# Patient Record
Sex: Female | Born: 1960 | Race: White | Hispanic: No | Smoking: Former smoker
Health system: Southern US, Community
[De-identification: ages and names within clinical notes are randomized; demographics above are authoritative.]

## PROBLEM LIST (undated history)

## (undated) DIAGNOSIS — K219 Gastro-esophageal reflux disease without esophagitis: Secondary | ICD-10-CM

## (undated) HISTORY — PX: OTHER SURGICAL HISTORY: SHX169

## (undated) HISTORY — DX: Gastro-esophageal reflux disease without esophagitis: K21.9

---

## 1999-10-05 ENCOUNTER — Other Ambulatory Visit: Admission: RE | Admit: 1999-10-05 | Discharge: 1999-10-05 | Payer: Self-pay | Admitting: Obstetrics and Gynecology

## 2002-03-11 ENCOUNTER — Other Ambulatory Visit: Admission: RE | Admit: 2002-03-11 | Discharge: 2002-03-11 | Payer: Self-pay | Admitting: Obstetrics & Gynecology

## 2016-05-19 ENCOUNTER — Encounter: Payer: Self-pay | Admitting: Family Medicine

## 2016-05-19 ENCOUNTER — Ambulatory Visit (INDEPENDENT_AMBULATORY_CARE_PROVIDER_SITE_OTHER): Payer: BLUE CROSS/BLUE SHIELD | Admitting: Family Medicine

## 2016-05-19 VITALS — BP 109/73 | HR 73 | Ht 65.25 in | Wt 219.9 lb

## 2016-05-19 DIAGNOSIS — F17200 Nicotine dependence, unspecified, uncomplicated: Secondary | ICD-10-CM

## 2016-05-19 DIAGNOSIS — Z6836 Body mass index (BMI) 36.0-36.9, adult: Secondary | ICD-10-CM | POA: Diagnosis not present

## 2016-05-19 DIAGNOSIS — Z1239 Encounter for other screening for malignant neoplasm of breast: Secondary | ICD-10-CM | POA: Diagnosis not present

## 2016-05-19 DIAGNOSIS — B078 Other viral warts: Secondary | ICD-10-CM

## 2016-05-19 DIAGNOSIS — Z0000 Encounter for general adult medical examination without abnormal findings: Secondary | ICD-10-CM | POA: Diagnosis not present

## 2016-05-19 DIAGNOSIS — Z716 Tobacco abuse counseling: Secondary | ICD-10-CM | POA: Insufficient documentation

## 2016-05-19 NOTE — Patient Instructions (Addendum)
-  AHA guidelines for exercise of 150 minutes of moderate intensity aerobic activity per week discussed and encouraged.   Discussed how regular exercise will improve brain function and memory, as well as improve mood, boost immune system and help with weight management, among the other, more well-known effects of exercise such as decreasing risk for hypertension, diabetes, hyperlipidemia etc.  - Sleep hygiene discussed. Counseled on negative effects of "blue light" from items such as computer screens, smart phones, TVs etc.   -  The AHA strongly endorses consumption of a diet that contains a variety of foods from all the food categories with an emphasis on fruits and vegetables; fat-free and low-fat dairy products; cereal and grain products; legumes and nuts; and fish, poultry, and or lean meats.   Excessive food intake, especially of foods high in saturated and trans fats, sugar, and salt, should be avoided   Steps to Quit Smoking  Smoking tobacco can be harmful to your health and can affect almost every organ in your body. Smoking puts you, and those around you, at risk for developing many serious chronic diseases. Quitting smoking is difficult, but it is one of the best things that you can do for your health. It is never too late to quit. WHAT ARE THE BENEFITS OF QUITTING SMOKING? When you quit smoking, you lower your risk of developing serious diseases and conditions, such as:  Lung cancer or lung disease, such as COPD.  Heart disease.  Stroke.  Heart attack.  Infertility.  Osteoporosis and bone fractures. Additionally, symptoms such as coughing, wheezing, and shortness of breath may get better when you quit. You may also find that you get sick less often because your body is stronger at fighting off colds and infections. If you are pregnant, quitting smoking can help to reduce your chances of having a baby of low birth weight. HOW DO I GET READY TO QUIT? When you decide to quit smoking,  create a plan to make sure that you are successful. Before you quit:  Pick a date to quit. Set a date within the next two weeks to give you time to prepare.  Write down the reasons why you are quitting. Keep this list in places where you will see it often, such as on your bathroom mirror or in your car or wallet.  Identify the people, places, things, and activities that make you want to smoke (triggers) and avoid them. Make sure to take these actions:  Throw away all cigarettes at home, at work, and in your car.  Throw away smoking accessories, such as Set designerashtrays and lighters.  Clean your car and make sure to empty the ashtray.  Clean your home, including curtains and carpets.  Tell your family, friends, and coworkers that you are quitting. Support from your loved ones can make quitting easier.  Talk with your health care provider about your options for quitting smoking.  Find out what treatment options are covered by your health insurance. WHAT STRATEGIES CAN I USE TO QUIT SMOKING?  Talk with your healthcare provider about different strategies to quit smoking. Some strategies include:  Quitting smoking altogether instead of gradually lessening how much you smoke over a period of time. Research shows that quitting "cold [place]" is more successful than gradually quitting.  Attending in-person counseling to help you build problem-solving skills. You are more likely to have success in quitting if you attend several counseling sessions. Even short sessions of 10 minutes can be effective.  Finding resources  and support systems that can help you to quit smoking and remain smoke-free after you quit. These resources are most helpful when you use them often. They can include:  Online chats with a Veterinary surgeon.  Telephone quitlines.  Printed Materials engineer.  Support groups or group counseling.  Text messaging programs.  Mobile phone applications.  Taking medicines to help you quit  smoking. (If you are pregnant or breastfeeding, talk with your health care provider first.) Some medicines contain nicotine and some do not. Both types of medicines help with cravings, but the medicines that include nicotine help to relieve withdrawal symptoms. Your health care provider may recommend:  Nicotine patches, gum, or lozenges.  Nicotine inhalers or sprays.  Non-nicotine medicine that is taken by mouth. Talk with your health care provider about combining strategies, such as taking medicines while you are also receiving in-person counseling. Using these two strategies together makes you more likely to succeed in quitting than if you used either strategy on its own. If you are pregnant or breastfeeding, talk with your health care provider about finding counseling or other support strategies to quit smoking. Do not take medicine to help you quit smoking unless told to do so by your health care provider. WHAT THINGS CAN I DO TO MAKE IT EASIER TO QUIT? Quitting smoking might feel overwhelming at first, but there is a lot that you can do to make it easier. Take these important actions:  Reach out to your family and friends and ask that they support and encourage you during this time. Call telephone quitlines, reach out to support groups, or work with a counselor for support.  Ask people who smoke to avoid smoking around you.  Avoid places that trigger you to smoke, such as bars, parties, or smoke-break areas at work.  Spend time around people who do not smoke.  Lessen stress in your life, because stress can be a smoking trigger for some people. To lessen stress, try:  Exercising regularly.  Deep-breathing exercises.  Yoga.  Meditating.  Performing a body scan. This involves closing your eyes, scanning your body from head to toe, and noticing which parts of your body are particularly tense. Purposefully relax the muscles in those areas.  Download or purchase mobile phone or tablet  apps (applications) that can help you stick to your quit plan by providing reminders, tips, and encouragement. There are many free apps, such as QuitGuide from the [employer] developer for Disease Control and Prevention). You can find other support for quitting smoking (smoking cessation) through smokefree.gov and other websites. HOW WILL I FEEL WHEN I QUIT SMOKING? Within the first 24 hours of quitting smoking, you may start to feel some withdrawal symptoms. These symptoms are usually most noticeable 2-3 days after quitting, but they usually do not last beyond 2-3 weeks. Changes or symptoms that you might experience include:  Mood swings.  Restlessness, anxiety, or irritation.  Difficulty concentrating.  Dizziness.  Strong cravings for sugary foods in addition to nicotine.  Mild weight gain.  Constipation.  Nausea.  Coughing or a sore throat.  Changes in how your medicines work in your body.  A depressed mood.  Difficulty sleeping (insomnia). After the first 2-3 weeks of quitting, you may start to notice more positive results, such as:  Improved sense of smell and taste.  Decreased coughing and sore throat.  Slower heart rate.  Lower blood pressure.  Clearer skin.  The ability to breathe more easily.  Fewer sick days. Quitting smoking is  very challenging for most people. Do not get discouraged if you are not successful the first time. Some people need to make many attempts to quit before they achieve long-term success. Do your best to stick to your quit plan, and talk with your health care provider if you have any questions or concerns.   This information is not intended to replace advice given to you by your health care provider. Make sure you discuss any questions you have with your health care provider.   Document Released: 08/29/2001 Document Revised: 01/19/2015 Document Reviewed: 01/19/2015    Smoking Cessation, Tips for Success If you are ready to quit smoking,  congratulations! You have chosen to help yourself be healthier. Cigarettes bring nicotine, tar, carbon monoxide, and other irritants into your body. Your lungs, heart, and blood vessels will be able to work better without these poisons. There are many different ways to quit smoking. Nicotine gum, nicotine patches, a nicotine inhaler, or nicotine nasal spray can help with physical craving. Hypnosis, support groups, and medicines help break the habit of smoking. WHAT THINGS CAN I DO TO MAKE QUITTING EASIER?  Here are some tips to help you quit for good:  Pick a date when you will quit smoking completely. Tell all of your friends and family about your plan to quit on that date.  Do not try to slowly cut down on the number of cigarettes you are smoking. Pick a quit date and quit smoking completely starting on that day.  Throw away all cigarettes.   Clean and remove all ashtrays from your home, work, and car.  On a card, write down your reasons for quitting. Carry the card with you and read it when you get the urge to smoke.  Cleanse your body of nicotine. Drink enough water and fluids to keep your urine clear or pale yellow. Do this after quitting to flush the nicotine from your body.  Learn to predict your moods. Do not let a bad situation be your excuse to have a cigarette. Some situations in your life might tempt you into wanting a cigarette.  Never have "just one" cigarette. It leads to wanting another and another. Remind yourself of your decision to quit.  Change habits associated with smoking. If you smoked while driving or when feeling stressed, try other activities to replace smoking. Stand up when drinking your coffee. Brush your teeth after eating. Sit in a different chair when you read the paper. Avoid alcohol while trying to quit, and try to drink fewer caffeinated beverages. Alcohol and caffeine may urge you to smoke.  Avoid foods and drinks that can trigger a desire to smoke, such as  sugary or spicy foods and alcohol.  Ask people who smoke not to smoke around you.  Have something planned to do right after eating or having a cup of coffee. For example, plan to take a walk or exercise.  Try a relaxation exercise to calm you down and decrease your stress. Remember, you may be tense and nervous for the first 2 weeks after you quit, but this will pass.  Find new activities to keep your hands busy. Play with a pen, coin, or rubber band. Doodle or draw things on paper.  Brush your teeth right after eating. This will help cut down on the craving for the taste of tobacco after meals. You can also try mouthwash.   Use oral substitutes in place of cigarettes. Try using lemon drops, carrots, cinnamon sticks, or chewing gum. Keep  them handy so they are available when you have the urge to smoke.  When you have the urge to smoke, try deep breathing.  Designate your home as a nonsmoking area.  If you are a heavy smoker, ask your health care provider about a prescription for nicotine chewing gum. It can ease your withdrawal from nicotine.  Reward yourself. Set aside the cigarette money you save and buy yourself something nice.  Look for support from others. Join a support group or smoking cessation program. Ask someone at home or at work to help you with your plan to quit smoking.  Always ask yourself, "Do I need this cigarette or is this just a reflex?" Tell yourself, "Today, I choose not to smoke," or "I do not want to smoke." You are reminding yourself of your decision to quit.  Do not replace cigarette smoking with electronic cigarettes (commonly called e-cigarettes). The safety of e-cigarettes is unknown, and some may contain harmful chemicals.  If you relapse, do not give up! Plan ahead and think about what you will do the next time you get the urge to smoke. HOW WILL I FEEL WHEN I QUIT SMOKING? You may have symptoms of withdrawal because your body is used to nicotine (the  addictive substance in cigarettes). You may crave cigarettes, be irritable, feel very hungry, cough often, get headaches, or have difficulty concentrating. The withdrawal symptoms are only temporary. They are strongest when you first quit but will go away within 10-14 days. When withdrawal symptoms occur, stay in control. Think about your reasons for quitting. Remind yourself that these are signs that your body is healing and getting used to being without cigarettes. Remember that withdrawal symptoms are easier to treat than the major diseases that smoking can cause.  Even after the withdrawal is over, expect periodic urges to smoke. However, these cravings are generally short lived and will go away whether you smoke or not. Do not smoke! WHAT RESOURCES ARE AVAILABLE TO HELP ME QUIT SMOKING? Your health care provider can direct you to community resources or hospitals for support, which may include:  Group support.  Education.  Hypnosis.  Therapy.   This information is not intended to replace advice given to you by your health care provider. Make sure you discuss any questions you have with your health care provider.   Document Released: 06/02/2004 Document Revised: 09/25/2014 Document Reviewed: 02/20/2013 Elsevier Interactive Patient Education 2016 [employer].  Risk analyst Patient Education [employer]

## 2016-05-19 NOTE — Progress Notes (Signed)
New patient office visit note:  Impression and Recommendations:    1. Screening for breast cancer   2. BMI 36.0-36.9,adult   3. Routine general medical examination at a health care facility   4. Common wart    Routine counseling for general yearly screenings for HIV, hepatitis C as well as mammograms, colonoscopies etc. discussed with patient.  BMI 36.0-36.9,adult Explained to patient what BMI refers to, and what it means medically.    Told patient to think about it as a "medical risk stratification measurement" and how increasing BMI is associated with increasing risk/ or worsening state of various diseases such as hypertension, hyperlipidemia, diabetes, premature OA, depression etc.  American Heart Association guidelines for healthy diet, basically Mediterranean diet, and exercise guidelines of 30 minutes 5 days per week or more discussed in detail.  Health counseling performed.  All questions answered.  --> We'll obtain yearly screening blood work today since patient has not had any in several years.Maryclare Labrador see her back in a couple weeks to discuss results. We'll make to make appointment for yearly physical couple months thereafter.  h/o Common wart- legs Patient uses over-the-counter Compound W with good results. Told her we could freeze them off with liquid nitrogen if she would like.  Pt was in the office today for 40+ minutes, with over 50% time spent in face to face counseling of various medical concerns and in coordination of care  Orders Placed This Encounter  Procedures  . MM DIGITAL SCREENING BILATERAL  . COMPLETE METABOLIC PANEL WITH GFR  . CBC with Differential/Platelet  . Hepatitis C antibody  . Hemoglobin A1c  . Lipid panel  . TSH  . VITAMIN D 25 Hydroxy (Vit-D Deficiency, Fractures)  . HIV antibody    Patient's Medications  New Prescriptions   No medications on file  Previous Medications   LYSINE 1000 MG TABS    Take 1 tablet by mouth daily.    Modified Medications   No medications on file  Discontinued Medications   No medications on file    Return in about 3 weeks (around 06/09/2016) for discuss bld wrk.  The patient was counseled, risk factors were discussed, anticipatory guidance given.  Gross side effects, risk and benefits, and alternatives of medications discussed with patient.  Patient is aware that all medications have potential side effects and we are unable to predict every side effect or drug-drug interaction that may occur.  Expresses verbal understanding and consents to current therapy plan and treatment regimen.  Please see AVS handed out to patient at the end of our visit for further patient instructions/ counseling done pertaining to today's office visit.    Note: This document was prepared using Dragon voice recognition software and may include unintentional dictation errors.  ----------------------------------------------------------------------------------------------------------------------    Subjective:    Chief Complaint  Patient presents with  . Establish Care    HPI: Angela Long is a pleasant 55 y.o. female who presents to [facility] today to review their medical history with me and establish care.   - no blood wrk or Doc Visit in many yrs.  Last PAP-- 10 yrs at least.  Mammo- 1 in past 59yrs.  Never had colonoscopy - never will.    Partner 3-4 yrs now- Jillyn Hidden and lives with him and his son [place]- 6 yo.  She has 3 kids previous marriage. Twin 28yo- Hunter and [place], and 54yo S-  Darrol AngelHogan.   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   No exercise- no time and just doesn';t.  Used to walk daily- low carb, cut back a lot on drinking - lost 54 lbs 4 yrs ago doing low carb.    fam h/o DM in [place] at age 54- morbidly obese. No other major dx at younger ages.    Wt Readings from Last 3 Encounters:  05/19/16 219 lb 14.4 oz (99.7 kg)   BP Readings from Last 3  Encounters:  05/19/16 109/73   Pulse Readings from Last 3 Encounters:  05/19/16 73     Patient Active Problem List   Diagnosis Date Noted  . BMI 36.0-36.9,adult 05/19/2016  . h/o Common wart- legs 05/19/2016     Past Medical History:  Diagnosis Date  . GERD (gastroesophageal reflux disease)      Past Surgical History:  Procedure Laterality Date  . CESAREAN SECTION       Family History  Problem Relation Age of Onset  . Hypertension Mother   . Neuropathy Mother   . Heart disease Mother [id number]    PM  . Cancer Father [id number]    " abdominal cancer"  . Alcohol abuse Sister   . Hypertension Sister [id number]  . Diabetes Brother 45    type II  . Diabetes Maternal Grandmother   . Cancer Maternal Grandmother     pancreatic  . Drug abuse Son      History  Drug Use No    History  Alcohol Use  . 10.8 oz/week  . 18 Cans of beer per week    Comment: drinks on weekends only    History  Smoking Status  . Current Some Day Smoker  . Packs/day: 0.01  . Years: 35.00  Smokeless Tobacco  . Never Used    Patient's Medications  New Prescriptions   No medications on file  Previous Medications   LYSINE 1000 MG TABS    Take 1 tablet by mouth daily.  Modified Medications   No medications on file  Discontinued Medications   No medications on file    Allergies: Review of patient's allergies indicates no known allergies.  Review of Systems:   ( Completed via Adult Medical History Intake form today ) General:  Denies fever, chills, appetite changes, unexplained weight loss.  Optho/Auditory:   Denies visual changes, blurred vision/LOV, ringing in ears/ diff hearing Respiratory:   Denies SOB, DOE, cough, wheezing.  Cardiovascular:   Denies chest pain, palpitations, new onset peripheral edema  Gastrointestinal:   Denies nausea, vomiting, diarrhea.  Genitourinary:    Denies dysuria, increased frequency, flank pain.  Endocrine:     Denies hot or cold intolerance, polyuria,  polydipsia. Musculoskeletal:  Denies unexplained myalgias, joint swelling, arthralgias, gait problems.  Skin:  Denies rash, suspicious lesions or new/ changes in moles Neurological:    Denies dizziness, syncope, unexplained weakness, lightheadedness, numbness  Psychiatric/Behavioral:   Denies mood changes, suicidal or homicidal ideations, hallucinations    Objective:   Blood pressure 109/73, pulse 73, height 5' 5.25" (1.657 m), weight 219 lb 14.4 oz (99.7 kg).   Body mass index is 36.31 kg/m.   General: Well Developed, well nourished, and in no acute distress.  Neuro: Alert and oriented x3, extra-ocular muscles intact, sensation grossly intact.  HEENT: Normocephalic, atraumatic, pupils equal round reactive to light, neck supple, no gross masses, no carotid bruits, no JVD apprec Skin: no gross suspicious lesions or rashes  Cardiac: Regular rate and  rhythm, no murmurs rubs or gallops.  Respiratory: Essentially clear to auscultation bilaterally. Not using accessory muscles, speaking in full sentences.  Abdominal: Soft, not grossly distended Musculoskeletal: Ambulates w/o diff, FROM * 4 ext.  Vasc: less 2 sec cap RF, warm and pink  Psych:  No HI/SI, judgement and insight good, Euthymic mood. Full Affect.

## 2016-05-19 NOTE — Assessment & Plan Note (Signed)
Explained to patient what BMI refers to, and what it means medically.    Told patient to think about it as a "medical risk stratification measurement" and how increasing BMI is associated with increasing risk/ or worsening state of various diseases such as hypertension, hyperlipidemia, diabetes, premature OA, depression etc.  American Heart Association guidelines for healthy diet, basically Mediterranean diet, and exercise guidelines of 30 minutes 5 days per week or more discussed in detail.  Health counseling performed.  All questions answered.  --> We'll obtain yearly screening blood work today since patient has not had any in several years.Angela Long.   We'll see her back in a couple weeks to discuss results. We'll make to make appointment for yearly physical couple months thereafter.

## 2016-05-19 NOTE — Assessment & Plan Note (Addendum)
Encouraged abstinence; will consider it

## 2016-05-19 NOTE — Assessment & Plan Note (Signed)
Patient uses over-the-counter Compound W with good results. Told her we could freeze them off with liquid nitrogen if she would like.

## 2016-05-20 LAB — COMPLETE METABOLIC PANEL WITH GFR
ALBUMIN: 4.1 g/dL (ref 3.6–5.1)
ALT: 14 U/L (ref 6–29)
AST: 17 U/L (ref 10–35)
Alkaline Phosphatase: 81 U/L (ref 33–130)
BILIRUBIN TOTAL: 0.5 mg/dL (ref 0.2–1.2)
BUN: 10 mg/dL (ref 7–25)
CALCIUM: 9.3 mg/dL (ref 8.6–10.4)
CO2: 27 mmol/L (ref 20–31)
CREATININE: 0.6 mg/dL (ref 0.50–1.05)
Chloride: 105 mmol/L (ref 98–110)
GFR, Est Non African American: >89 mL/min (ref >=60)
Glucose, Bld: 79 mg/dL (ref 65–99)
Potassium: 5.4 mmol/L — ABNORMAL HIGH (ref 3.5–5.3)
Sodium: 142 mmol/L (ref 135–146)
TOTAL PROTEIN: 7.1 g/dL (ref 6.1–8.1)

## 2016-05-20 LAB — CBC WITH DIFFERENTIAL/PLATELET
BASOS PCT: 0 %
Basophils Absolute: 0 cells/uL (ref 0–200)
EOS ABS: 153 {cells}/uL (ref 15–500)
Eosinophils Relative: 3 %
HCT: 41.2 % (ref 35.0–45.0)
Hemoglobin: 13.8 g/dL (ref 11.7–15.5)
LYMPHS PCT: 29 %
Lymphs Abs: 1479 cells/uL (ref 850–3900)
MCH: 32.7 pg (ref 27.0–33.0)
MCHC: 33.5 g/dL (ref 32.0–36.0)
MCV: 97.6 fL (ref 80.0–100.0)
MONOS PCT: 11 %
MPV: 10.3 fL (ref 7.5–12.5)
Monocytes Absolute: 561 cells/uL (ref 200–950)
NEUTROS PCT: 57 %
Neutro Abs: 2907 cells/uL (ref 1500–7800)
PLATELETS: 259 10*3/uL (ref 140–400)
RBC: 4.22 MIL/uL (ref 3.80–5.10)
RDW: 13.3 % (ref 11.0–15.0)
WBC: 5.1 10*3/uL (ref 3.8–10.8)

## 2016-05-20 LAB — HIV ANTIBODY (ROUTINE TESTING W REFLEX): HIV 1&2 Ab, 4th Generation: NONREACTIVE

## 2016-05-20 LAB — VITAMIN D 25 HYDROXY (VIT D DEFICIENCY, FRACTURES): VIT D 25 HYDROXY: 32 ng/mL (ref 30–100)

## 2016-05-20 LAB — HEMOGLOBIN A1C
Hgb A1c MFr Bld: 4.7 % (ref <5.7)
MEAN PLASMA GLUCOSE: 88 mg/dL

## 2016-05-20 LAB — LIPID PANEL
CHOL/HDL RATIO: 2.1 ratio (ref <=5.0)
Cholesterol: 202 mg/dL — ABNORMAL HIGH (ref 125–200)
HDL: 95 mg/dL (ref >=46)
LDL CALC: 98 mg/dL (ref <130)
Triglycerides: 43 mg/dL (ref <150)
VLDL: 9 mg/dL (ref <30)

## 2016-05-20 LAB — TSH: TSH: 3.58 m[IU]/L

## 2016-05-20 LAB — HEPATITIS C ANTIBODY: HCV Ab: NEGATIVE

## 2016-05-31 ENCOUNTER — Ambulatory Visit (INDEPENDENT_AMBULATORY_CARE_PROVIDER_SITE_OTHER): Payer: BLUE CROSS/BLUE SHIELD | Admitting: Family Medicine

## 2016-05-31 ENCOUNTER — Encounter: Payer: Self-pay | Admitting: Family Medicine

## 2016-05-31 VITALS — BP 122/83 | HR 87 | Ht 65.25 in | Wt 222.5 lb

## 2016-05-31 DIAGNOSIS — H8110 Benign paroxysmal vertigo, unspecified ear: Secondary | ICD-10-CM | POA: Insufficient documentation

## 2016-05-31 DIAGNOSIS — J329 Chronic sinusitis, unspecified: Secondary | ICD-10-CM

## 2016-05-31 DIAGNOSIS — J3089 Other allergic rhinitis: Secondary | ICD-10-CM | POA: Diagnosis not present

## 2016-05-31 MED ORDER — FLUTICASONE PROPIONATE 50 MCG/ACT NA SUSP: 2.0000 | Freq: Every day | NASAL | 6 refills | Status: DC

## 2016-05-31 MED ORDER — FEXOFENADINE-PSEUDOEPHED ER 180-240 MG PO TB24: 1.0000 | ORAL_TABLET | Freq: Every day | ORAL | 9 refills | Status: DC

## 2016-05-31 NOTE — Patient Instructions (Addendum)
How to Treat Vertigo at Home with Exercises ? ?What is Vertigo? ? ?Vertigo is a relatively common symptom most often associated with conditions such as sinusitis (inflammation of your sinuses due to viruses, allergies, or bacterial infections), or an inner ear infection or ear trauma.   It can be brought on by trauma (e.g. a blow to the head or whiplash) or more serious things like minor strokes.   Symptoms can also be brought on by normal degenerative changes to your inner ear that occur with aging.  The condition tends to be more commonly seen in the elderly but it can occur in all ages.   ? ?Patients most often complain of dizziness, as if the room is spinning around them.   Symptoms are provoked by quick head movements or changes in position like going from standing to lying in bed, or even turning over in bed.   It may present with nausea and/or vomiting, and can be very debilitating to some folks.   ? ?By far the most common cause, known as Benign Paroxysmal Positional Vertigo (BPPV), is categorized by a sudden onset of symptoms, that are intense but short-lived (60 seconds or less), which is triggered by a change in head position.   Symptoms usually dissipate if you stay in one position and do not move your head.   Within the inner ear are collections of calcium carbonate crystals referred to as ?otoliths? which may become dislodged from their normal position and migrate into the semicircular canals of the inner ear, throwing off your body's ability to sense where you are in space.   ? ? ?Fig. 921 Anatomy of the Right Osseous Labyrinth. Henry Gray. Anatomy of the Human Body. 1918. ? ?  ? ?  ? ?  ? ? ?What Else Could Be Behind My Vertigo? ? ?Some other causes of vertigo include: ? ?Meniere?s disease (disorder of inner ear with ringing in ears, feeling of fullness/pressure within ear, and fluctuating hearing loss) ?Tumours ?Neurological disorders e.g. Multiple Sclerosis ?Motion Sickness (lack of coordination  between visual stimuli, inner ear balance and positional sense) ?Migraine ?Labyrinthitis (inflammation of the fluid-filled tubes and sacs within the inner ear; may also be associated with changes in hearing) ?Vestibular neuritis (inflammation of the nerves associated with transmission of sensory info from the inner ear; usually of viral origins) ? ?How it can be treated/cured? ?While certain medications have been prescribed for vertigo including Lorazepam your doing well 7 house the house going organizing and getting things ready for sale with the and Meclizine (for motion sickness), there exists no evidence to support a recommendation of any medication in the routine treatment of BPPV.  Clinical trials have demonstrated that repositioning techniques (listed below) are a superior option for management (Fife et al., 2008). ? ? ? ?Figure above:  (A) Instructions for the modified Epley procedure (MEP) for left ear posterior canal benign paroxysmal positional vertigo (PC-BPPV). For right ear BPPV, the procedure has to be performed in the opposite direction, starting with the head turned to the right side.  ?1. Start by sitting on a bed with your head turned 45? to the left. Place a pillow behind you so that on lying back it will be under your shoulders.  ?2. Lie back quickly with shoulders on the pillow, neck extended, and head resting on the bed. In this position, the affected (left) ear is underneath. Wait for 30 secondS.  ?3. Turn your head 90? to the right (without raising it), and   wait again for 30 seconds.  ?4. Turn your body and head another 90? to the right, and wait for another 30 seconds.  ?5. Sit up on the right side. This maneuver should be performed three times a day. Repeat this daily until you are free from positional vertigo for 24 hours. ? ? (B) Instructions for the modified Semont maneuver (MSM) for left ear PC-BPPV. For right ear BPPV, the maneuver has to be performed in the opposite direction,  starting with the head turned toward the left ear.  ?1. Sit upright on a bed with your head turned 45? toward the right ear.  ?2. Drop quickly to the left side, so that your head touches the bed behind your left ear. Wait 30 seconds.  ?3. Move head and trunk in a swift movement toward the other side without stopping in the upright position, so that your head comes to rest on the right side of your forehead. Wait again for 30 seconds.  ?4. Sit up again.  ?This maneuver should be performed three times a day. Repeat this daily until you are free from positional vertigo symptoms for 24 hours.  ? ?(   See the video in the supplementary material on the NeurologyWeb site; go to http://www.neurology.org/content/63/1/150/F1.expansion.html   ) ? ? ? ? ?You can also try this motion at home as well- ?Self-Treatment of Benign Paroxysmal Positional Vertigo ?Benign Paroxysmal Positioning Vertigo is caused by loose inner ear crystals in the inner ear that migrate while sleeping to the back-bottom inner ear balance canal, the so-called ?posterior semi-circular canal.? The maneuver demonstrated below is the way to reposition the loose crystals so that the symptoms caused by the loose crystals go away. You may have a floating, swaying sense while walking or sitting for a few days after this procedure.  ? ? ? ? ? ? ? ? ? ? ?

## 2016-05-31 NOTE — Assessment & Plan Note (Signed)
Pathophysiology of benign positional vertigo discussed with patient.   Showed patient pictures of eustachian tube dysfunction and how this occurs.   Handouts given on home therapy and importance of it on repeated basis  Medications as given- r/b d/c pt  Follow-up if develops any red flag symptoms

## 2016-05-31 NOTE — Progress Notes (Signed)
Impression and Recommendations:    1. BPPV (benign paroxysmal positional vertigo), unspecified laterality   2. Recurrent sinusitis   3. Environmental and seasonal allergies    BPPV (benign paroxysmal positional vertigo) Pathophysiology of benign positional vertigo discussed with patient.   Showed patient pictures of eustachian tube dysfunction and how this occurs.   Handouts given on home therapy and importance of it on repeated basis  Medications as given- r/b d/c pt  Follow-up if develops any red flag symptoms   Patient's Medications  New Prescriptions   FEXOFENADINE-PSEUDOEPHEDRINE (ALLEGRA-D ALLERGY & CONGESTION) 180-240 MG 24 HR TABLET    Take 1 tablet by mouth daily.   FLUTICASONE (FLONASE) 50 MCG/ACT NASAL SPRAY    Place 2 sprays into both nostrils daily.  Previous Medications   LYSINE 1000 MG TABS    Take 1 tablet by mouth daily.  Modified Medications   No medications on file  Discontinued Medications   No medications on file    Return for [name] has follow-up to discuss blood work in a couple weeks. We'll reassess then..  The patient was counseled, risk factors were discussed, anticipatory guidance given.  Gross side effects, risk and benefits, and alternatives of medications and treatment plan in general discussed with patient.  Patient is aware that all medications have potential side effects and we are unable to predict every side effect or drug-drug interaction that may occur.   Patient will call with any questions prior to using medication if they have concerns.  Expresses verbal understanding and consents to current therapy and treatment regimen.  No barriers to understanding were identified.  Red flag symptoms and signs discussed in detail.  Patient expressed understanding regarding what to do in case of emergency\urgent symptoms   Please see AVS handed out to patient at the end of our visit for further patient instructions/ counseling done pertaining to  today's office visit.    Note: This document was prepared using Dragon voice recognition software and may include unintentional dictation errors.   --------------------------------------------------------------------------------------------------------------------------------------------------------------------------------------------------------------------------------------------    Subjective:    CC:  Chief Complaint  Patient presents with  . Dizziness    HPI: Angela Long is a 55 y.o. female who presents to [facility] today for acute office visit for dizzy spells.  occ having "dizzy spells".  Had one episode last Wednesday- occurred while she was in the freezer at work lifting her head and reaching for something.  That also occur later on that afternoon while she was sitting on the bed. Was not moving at that time. No room spinning- just feels dizzy. ( False sense of motion).   Eyes feel little funny.    Dizzy spells last 5 min. this morning when she bent over to clean her cats puke off of the rug, she felt a little dizzy when she went to get up. Today during her orthostatics when she went from laying down to sitting up she felt dizzy in the office today.  No nausea or sick feeling.  No other symptoms at all no headaches, hearing loss, visual changes or neurological symptoms. Denies any cardiac symptoms.  Feels fine otherwise.  Episodes come and go.   Wt Readings from Last 3 Encounters:  05/31/16 222 lb 8 oz (100.9 kg)  05/19/16 219 lb 14.4 oz (99.7 kg)   BP Readings from Last 3 Encounters:  05/31/16 122/83  05/19/16 109/73   Pulse Readings from Last 3 Encounters:  05/31/16 87  05/19/16 73     Patient Active Problem List   Diagnosis Date Noted  . BPPV (benign paroxysmal positional vertigo) 05/31/2016  . BMI 36.0-36.9,adult 05/19/2016  . h/o Common wart- legs 05/19/2016  . Tobacco use disorder 05/19/2016  . Tobacco abuse counseling  05/19/2016    Past Medical history, Surgical history, Family history, Social history, Allergies and Medications have been entered into the medical record, reviewed and changed as needed.   Allergies:  No Known Allergies  Review of Systems: No fever/ chills, night sweats, no unintended weight loss, No chest pain, or increased shortness of breath. No N/V/D.  Pertinent positives and negatives noted in HPI above    Objective:    Blood pressure 122/83, pulse 87, height 5' 5.25" (1.657 m), weight 222 lb 8 oz (100.9 kg).  Orthostatic vitals were taken. Please see nurse's assessment for this walking section  General: Well Developed, well nourished, appropriate for stated age.  Neuro: Alert and oriented x3, extra-ocular muscles intact, sensation grossly intact, negative Romberg, no cerebellar signs, negative heel-to-shin, no arm drift. Positive Hallpike's in office. HEENT: Normocephalic, atraumatic, neck supple   Skin: Warm and dry, no gross rash. Cardiac: RRR, S1 S2,  no murmurs rubs or gallops.  Respiratory: ECTA B/L, Not using accessory muscles, speaking in full sentences-unlabored.  Vascular:  No gross lower ext edema, cap RF less 2 sec. Psych: No HI/SI, judgement and insight good, Euthymic mood. Full Affect.

## 2016-06-13 ENCOUNTER — Ambulatory Visit (INDEPENDENT_AMBULATORY_CARE_PROVIDER_SITE_OTHER): Payer: BLUE CROSS/BLUE SHIELD | Admitting: Family Medicine

## 2016-06-13 ENCOUNTER — Encounter: Payer: Self-pay | Admitting: Family Medicine

## 2016-06-13 VITALS — BP 113/73 | HR 69 | Ht 65.25 in | Wt 227.0 lb

## 2016-06-13 DIAGNOSIS — Z716 Tobacco abuse counseling: Secondary | ICD-10-CM

## 2016-06-13 DIAGNOSIS — E559 Vitamin D deficiency, unspecified: Secondary | ICD-10-CM

## 2016-06-13 DIAGNOSIS — H8110 Benign paroxysmal vertigo, unspecified ear: Secondary | ICD-10-CM | POA: Diagnosis not present

## 2016-06-13 DIAGNOSIS — F17200 Nicotine dependence, unspecified, uncomplicated: Secondary | ICD-10-CM | POA: Diagnosis not present

## 2016-06-13 DIAGNOSIS — Z6836 Body mass index (BMI) 36.0-36.9, adult: Secondary | ICD-10-CM

## 2016-06-13 NOTE — Progress Notes (Signed)
Assessment and plan:  1. BPPV (benign paroxysmal positional vertigo), unspecified laterality   2. Vitamin D insufficiency   3. BMI 36.0-36.9,adult   4. Tobacco use disorder   5. Tobacco abuse counseling     Vertigo-resolved. Patient has kept handouts of the exercises and will use them as needed in future  All lab values look great. HDL is high- at 95 and I explained how this is a very good thing to patient.     -Only advice was to take vitamin D supplementation of at least 2000 IUs daily  BMI counseling and dietary and exercise counseling performed.  She will do low-carb high-protein diet.  Encouraged to follow-up with me more frequently if she needs support\direction regarding weight loss  Advise 30 minutes of exercise-moderate intensity cardio-daily.  Highly encouraged smoking cessation. Patient not ready to quit but will continue to slowly cut back.   New Prescriptions   No medications on file    Modified Medications   No medications on file    Discontinued Medications   FEXOFENADINE-PSEUDOEPHEDRINE (ALLEGRA-D ALLERGY & CONGESTION) 180-240 MG 24 HR TABLET    Take 1 tablet by mouth daily.   FLUTICASONE (FLONASE) 50 MCG/ACT NASAL SPRAY    Place 2 sprays into both nostrils daily.     No Follow-up on file.  Anticipatory guidance and routine counseling done re: condition, txmnt options and need for follow up. All questions of patient's were answered.   Gross side effects, risk and benefits, and alternatives of medications discussed with patient.  Patient is aware that all medications have potential side effects and we are unable to predict every sideeffect or drug-drug interaction that may occur.  Expresses verbal understanding and consents to current therapy plan and treatment regiment.  Please see AVS handed out to patient at the end of our visit for additional patient instructions/ counseling done pertaining to today's  office visit.  Note: This document was prepared using Dragon voice recognition software and may include unintentional dictation errors.   ----------------------------------------------------------------------------------------------------------------------  Subjective:   CC: Angela Long is a 55 y.o. female who presents to [place] [facility] today for review and discussion of recent bloodwork that was done.  BPPV:  Symptoms from last office visit are completely gone- no longer suffering from dizziness or vertigo.   The exercises we gave her helped the most- symptoms were gone within 2-3 days without medicines.   She did not take any of the medicines, declines them.  - Patient nervous about her labs.  She is concerned due to her age and being obese, they will be bad.   She has started dieting and has done low carb, high protein in the past and has been very successful.  She plans on getting back added in the very near future.  - Not ready to discuss quitting smoking yet. She would like to get her weight under control first.  However, she has cut back since our last office visit.    Wt Readings from Last 3 Encounters:  06/13/16 227 lb (103 kg)  05/31/16 222 lb 8 oz (100.9 kg)  05/19/16 219 lb 14.4 oz (99.7 kg)   BP Readings from Last  3 Encounters:  06/13/16 113/73  05/31/16 122/83  05/19/16 109/73   Pulse Readings from Last 3 Encounters:  06/13/16 69  05/31/16 87  05/19/16 73   BMI Readings from Last 3 Encounters:  06/13/16 37.49 kg/m  05/31/16 36.74 kg/m  05/19/16 36.31 kg/m     Full medical history updated and reviewed in the office today  Patient Active Problem List   Diagnosis Date Noted  . Vitamin D insufficiency 06/17/2016  . BPPV (benign paroxysmal positional vertigo) 05/31/2016  . BMI 36.0-36.9,adult 05/19/2016  . h/o Common wart- legs 05/19/2016  . Tobacco use disorder 05/19/2016  . Tobacco abuse counseling 05/19/2016    Past Medical  History:  Diagnosis Date  . GERD (gastroesophageal reflux disease)     Past Surgical History:  Procedure Laterality Date  . CESAREAN SECTION      Social History  Substance Use Topics  . Smoking status: Current Some Day Smoker    Packs/day: 0.01    Years: 35.00  . Smokeless tobacco: Never Used  . Alcohol use 10.8 oz/week    18 Cans of beer per week     Comment: drinks on weekends only    family history includes Alcohol abuse in her sister; Cancer in her maternal grandmother; Cancer (age of onset: 61) in her father; Diabetes in her maternal grandmother; Diabetes (age of onset: 17) in her brother; Drug abuse in her son; Heart disease (age of onset: 42) in her mother; Hypertension in her mother; Hypertension (age of onset: 20) in her sister; Neuropathy in her mother.   Medications: Current Outpatient Prescriptions  Medication Sig Dispense Refill  . Lysine 1000 MG TABS Take 1 tablet by mouth daily.     No current facility-administered medications for this visit.     Allergies:  No Known Allergies   ROS: Review of Systems  Constitutional: Negative.  Negative for chills and fever.  HENT: Negative.   Eyes: Negative.  Negative for blurred vision and double vision.  Respiratory: Negative.  Negative for cough and shortness of breath.   Cardiovascular: Negative.  Negative for chest pain, palpitations and leg swelling.  Gastrointestinal: Negative.  Negative for diarrhea, nausea and vomiting.  Genitourinary: Negative.  Negative for dysuria, frequency and urgency.  Musculoskeletal: Negative.  Negative for joint pain and myalgias.  Skin: Negative.  Negative for rash.  Neurological: Negative.  Negative for dizziness and headaches.  Endo/Heme/Allergies: Negative.  Negative for polydipsia.  Psychiatric/Behavioral: Negative.  Negative for depression. The patient is not nervous/anxious.     Objective:  Blood pressure 113/73, pulse 69, height 5' 5.25" (1.657 m), weight 227 lb (103  kg). Body mass index is 37.49 kg/m. Gen:   Well NAD, A and O *3 HEENT:    [place]/AT, EOMI,  MMM, OP- clr Lungs:   Normal work of breathing. CTA B/L, no Wh, rhonchi Heart:   RRR, S1, S2 WNL's, no MRG Abd:   No gross distention Exts:    warm, pink,  Brisk capillary refill, warm and well perfused.  Psych:    No HI/SI, judgement and insight good, Euthymic mood. Full Affect.   Recent Results (from the past 2160 hour(s))  COMPLETE METABOLIC PANEL WITH GFR     Status: Abnormal   Collection Time: 05/19/16  9:00 AM  Result Value Ref Range   Sodium 142 135 - 146 mmol/L   Potassium 5.4 (H) 3.5 - 5.3 mmol/L    Comment: No visible hemolysis.   Chloride 105 98 - 110 mmol/L  CO2 27 20 - 31 mmol/L   Glucose, Bld 79 65 - 99 mg/dL   BUN 10 7 - 25 mg/dL   Creat 0.60 0.50 - 1.05 mg/dL    Comment:   For patients > or = 54 years of age: The upper reference limit for Creatinine is approximately 13% higher for people identified as African-American.      Total Bilirubin 0.5 0.2 - 1.2 mg/dL   Alkaline Phosphatase 81 33 - 130 U/L   AST 17 10 - 35 U/L   ALT 14 6 - 29 U/L   Total Protein 7.1 6.1 - 8.1 g/dL   Albumin 4.1 3.6 - 5.1 g/dL   Calcium 9.3 8.6 - 10.4 mg/dL   GFR, Est African American >89 >=60 mL/min   GFR, Est Non African American >89 >=60 mL/min  CBC with Differential/Platelet     Status: None   Collection Time: 05/19/16  9:00 AM  Result Value Ref Range   WBC 5.1 3.8 - 10.8 K/uL   RBC 4.22 3.80 - 5.10 MIL/uL   Hemoglobin 13.8 11.7 - 15.5 g/dL   HCT 41.2 35.0 - 45.0 %   MCV 97.6 80.0 - 100.0 fL   MCH 32.7 27.0 - 33.0 pg   MCHC 33.5 32.0 - 36.0 g/dL   RDW 13.3 11.0 - 15.0 %   Platelets 259 140 - 400 K/uL   MPV 10.3 7.5 - 12.5 fL   Neutro Abs 2,907 1,500 - 7,800 cells/uL   Lymphs Abs 1,479 850 - 3,900 cells/uL   Monocytes Absolute 561 200 - 950 cells/uL   Eosinophils Absolute 153 15 - 500 cells/uL   Basophils Absolute 0 0 - 200 cells/uL   Neutrophils Relative % 57 %   Lymphocytes  Relative 29 %   Monocytes Relative 11 %   Eosinophils Relative 3 %   Basophils Relative 0 %   Smear Review Criteria for review not met   Hepatitis C antibody     Status: None   Collection Time: 05/19/16  9:00 AM  Result Value Ref Range   HCV Ab NEGATIVE NEGATIVE  Hemoglobin A1c     Status: None   Collection Time: 05/19/16  9:00 AM  Result Value Ref Range   Hgb A1c MFr Bld 4.7 <5.7 %    Comment:   For the purpose of screening for the presence of diabetes:   <5.7%       Consistent with the absence of diabetes 5.7-6.4 %   Consistent with increased risk for diabetes (prediabetes) >=6.5 %     Consistent with diabetes   This assay result is consistent with a decreased risk of diabetes.   Currently, no consensus exists regarding use of hemoglobin A1c for diagnosis of diabetes in children.   According to American Diabetes Association (ADA) guidelines, hemoglobin A1c <7.0% represents optimal control in non-pregnant diabetic patients. Different metrics may apply to specific patient populations. Standards of Medical Care in Diabetes (ADA).      Mean Plasma Glucose 88 mg/dL  Lipid panel     Status: Abnormal   Collection Time: 05/19/16  9:00 AM  Result Value Ref Range   Cholesterol 202 (H) 125 - 200 mg/dL   Triglycerides 43 <150 mg/dL   HDL 95 >=46 mg/dL   Total CHOL/HDL Ratio 2.1 <=5.0 Ratio   VLDL 9 <30 mg/dL   LDL Cholesterol 98 <130 mg/dL    Comment:   Total Cholesterol/HDL Ratio:CHD Risk  Coronary Heart Disease Risk Table                                        Men       Women          1/2 Average Risk              3.4        3.3              Average Risk              5.0        4.4           2X Average Risk              9.6        7.1           3X Average Risk             23.4       11.0 Use the calculated Patient Ratio above and the CHD Risk table  to determine the patient's CHD Risk.   TSH     Status: None   Collection Time: 05/19/16  9:00 AM   Result Value Ref Range   TSH 3.58 mIU/L    Comment:   Reference Range   > or = 20 Years  0.40-4.50   Pregnancy Range First trimester  0.26-2.66 Second trimester 0.55-2.73 Third trimester  0.43-2.91     VITAMIN D 25 Hydroxy (Vit-D Deficiency, Fractures)     Status: None   Collection Time: 05/19/16  9:00 AM  Result Value Ref Range   Vit D, 25-Hydroxy 32 30 - 100 ng/mL    Comment: Vitamin D Status           25-OH Vitamin D        Deficiency                <20 ng/mL        Insufficiency         20 - 29 ng/mL        Optimal             > or = 30 ng/mL   For 25-OH Vitamin D testing on patients on D2-supplementation and patients for whom quantitation of D2 and D3 fractions is required, the [facility] 25-OH VIT D, (D2,D3), LC/MS/MS is recommended: order code [id number] (patients > 2 yrs).   HIV antibody     Status: None   Collection Time: 05/19/16  9:00 AM  Result Value Ref Range   HIV 1&2 Ab, 4th Generation NONREACTIVE NONREACTIVE    Comment:   HIV-1 antigen and HIV-1/HIV-2 antibodies were not detected.  There is no laboratory evidence of HIV infection.   HIV-1/2 Antibody Diff        Not indicated. HIV-1 RNA, Qual TMA          Not indicated.     PLEASE NOTE: This information has been disclosed to you from records whose confidentiality may be protected by state law. If your state requires such protection, then the state law prohibits you from making any further disclosure of the information without the specific written consent of the person to whom it pertains, or as otherwise permitted by law. A general authorization for the release of medical or other information is NOT sufficient for this purpose.  The performance of this assay has not been clinically validated in patients less than 54 years old.   For additional information please refer to http://education.quest[facility].com/faq/FAQ106.  (This link is being provided for informational/educational purposes only.)

## 2016-06-17 DIAGNOSIS — E559 Vitamin D deficiency, unspecified: Secondary | ICD-10-CM | POA: Insufficient documentation

## 2020-06-14 DIAGNOSIS — U071 COVID-19: Secondary | ICD-10-CM | POA: Diagnosis not present

## 2020-06-14 DIAGNOSIS — Z20828 Contact with and (suspected) exposure to other viral communicable diseases: Secondary | ICD-10-CM | POA: Diagnosis not present

## 2020-06-15 ENCOUNTER — Telehealth: Payer: Self-pay | Admitting: Internal Medicine

## 2020-06-15 DIAGNOSIS — U071 COVID-19: Secondary | ICD-10-CM | POA: Diagnosis not present

## 2020-06-15 DIAGNOSIS — R110 Nausea: Secondary | ICD-10-CM | POA: Diagnosis not present

## 2020-06-15 NOTE — Telephone Encounter (Signed)
I connected by phone with Angela Long on 06/15/2020 at 12:16 PM to discuss the potential use of a new treatment for mild to moderate COVID-19 viral infection in non-hospitalized patients.  This patient is a 59 y.o. female that meets the FDA criteria for Emergency Use Authorization of COVID monoclonal antibody casirivimab/imdevimab or bamlanivimab/eteseviamb.  Has a (+) direct SARS-CoV-2 viral test result  Has mild or moderate COVID-19   Is NOT hospitalized due to COVID-19  Is within 10 days of symptom onset  Has at least one of the high risk factor(s) for progression to severe COVID-19 and/or hospitalization as defined in EUA.  Specific high risk criteria : BMI > 25   I have spoken and communicated the following to the patient or parent/caregiver regarding COVID monoclonal antibody treatment:  1. FDA has authorized the emergency use for the treatment of mild to moderate COVID-19 in adults and pediatric patients with positive results of direct SARS-CoV-2 viral testing who are 78 years of age and older weighing at least 40 kg, and who are at high risk for progressing to severe COVID-19 and/or hospitalization.  2. The significant known and potential risks and benefits of COVID monoclonal antibody, and the extent to which such potential risks and benefits are unknown.  3. Information on available alternative treatments and the risks and benefits of those alternatives, including clinical trials.  4. Patients treated with COVID monoclonal antibody should continue to self-isolate and use infection control measures (e.g., wear mask, isolate, social distance, avoid sharing personal items, clean and disinfect "high touch" surfaces, and frequent handwashing) according to CDC guidelines.   5. The patient or parent/caregiver has the option to accept or refuse COVID monoclonal antibody treatment.  After reviewing this information with the patient, the patient has DECLINED offer to receive the  infusion. Willow Ora, MD 06/15/2020 12:16 PM

## 2020-08-11 DIAGNOSIS — J189 Pneumonia, unspecified organism: Secondary | ICD-10-CM | POA: Diagnosis not present

## 2020-08-11 DIAGNOSIS — R059 Cough, unspecified: Secondary | ICD-10-CM | POA: Diagnosis not present

## 2020-08-11 DIAGNOSIS — U071 COVID-19: Secondary | ICD-10-CM | POA: Diagnosis not present

## 2020-08-11 DIAGNOSIS — R051 Acute cough: Secondary | ICD-10-CM | POA: Diagnosis not present

## 2020-08-11 DIAGNOSIS — R062 Wheezing: Secondary | ICD-10-CM | POA: Diagnosis not present

## 2020-08-11 DIAGNOSIS — Z20828 Contact with and (suspected) exposure to other viral communicable diseases: Secondary | ICD-10-CM | POA: Diagnosis not present

## 2020-08-11 DIAGNOSIS — H6122 Impacted cerumen, left ear: Secondary | ICD-10-CM | POA: Diagnosis not present

## 2020-08-27 DIAGNOSIS — H938X9 Other specified disorders of ear, unspecified ear: Secondary | ICD-10-CM | POA: Diagnosis not present

## 2020-11-23 DIAGNOSIS — Z20828 Contact with and (suspected) exposure to other viral communicable diseases: Secondary | ICD-10-CM | POA: Diagnosis not present

## 2020-11-23 DIAGNOSIS — R490 Dysphonia: Secondary | ICD-10-CM | POA: Diagnosis not present

## 2020-11-23 DIAGNOSIS — R051 Acute cough: Secondary | ICD-10-CM | POA: Diagnosis not present

## 2021-02-17 ENCOUNTER — Emergency Department (HOSPITAL_COMMUNITY)
Admission: EM | Admit: 2021-02-17 | Discharge: 2021-02-17 | Disposition: A | Discharge status: Home / Self Care | Payer: BC Managed Care – PPO | Attending: Emergency Medicine | Admitting: Emergency Medicine

## 2021-02-17 ENCOUNTER — Other Ambulatory Visit: Payer: Self-pay

## 2021-02-17 ENCOUNTER — Emergency Department (HOSPITAL_COMMUNITY): Payer: BC Managed Care – PPO

## 2021-02-17 DIAGNOSIS — F17200 Nicotine dependence, unspecified, uncomplicated: Secondary | ICD-10-CM | POA: Diagnosis not present

## 2021-02-17 DIAGNOSIS — R202 Paresthesia of skin: Secondary | ICD-10-CM | POA: Insufficient documentation

## 2021-02-17 DIAGNOSIS — R079 Chest pain, unspecified: Secondary | ICD-10-CM | POA: Diagnosis not present

## 2021-02-17 DIAGNOSIS — I10 Essential (primary) hypertension: Secondary | ICD-10-CM | POA: Diagnosis not present

## 2021-02-17 DIAGNOSIS — R000 Tachycardia, unspecified: Secondary | ICD-10-CM | POA: Diagnosis not present

## 2021-02-17 DIAGNOSIS — R0789 Other chest pain: Secondary | ICD-10-CM | POA: Diagnosis not present

## 2021-02-17 LAB — BASIC METABOLIC PANEL
Anion gap: 9 (ref 5–15)
BUN: 11 mg/dL (ref 6–20)
CO2: 27 mmol/L (ref 22–32)
Calcium: 9 mg/dL (ref 8.9–10.3)
Chloride: 103 mmol/L (ref 98–111)
Creatinine, Ser: 0.69 mg/dL (ref 0.44–1.00)
GFR, Estimated: >60 mL/min (ref >60)
Glucose, Bld: 89 mg/dL (ref 70–99)
Potassium: 3.4 mmol/L — ABNORMAL LOW (ref 3.5–5.1)
Sodium: 139 mmol/L (ref 135–145)

## 2021-02-17 LAB — TROPONIN I (HIGH SENSITIVITY)
Troponin I (High Sensitivity): 4 ng/L (ref <18)
Troponin I (High Sensitivity): 5 ng/L (ref <18)

## 2021-02-17 LAB — CBC
HCT: 38.9 % (ref 36.0–46.0)
Hemoglobin: 13.3 g/dL (ref 12.0–15.0)
MCH: 35.5 pg — ABNORMAL HIGH (ref 26.0–34.0)
MCHC: 34.2 g/dL (ref 30.0–36.0)
MCV: 103.7 fL — ABNORMAL HIGH (ref 80.0–100.0)
Platelets: 215 10*3/uL (ref 150–400)
RBC: 3.75 MIL/uL — ABNORMAL LOW (ref 3.87–5.11)
RDW: 13.4 % (ref 11.5–15.5)
WBC: 6.6 10*3/uL (ref 4.0–10.5)
nRBC: 0 % (ref 0.0–0.2)

## 2021-02-17 NOTE — ED Provider Notes (Signed)
Care assumed from [facility], [place] at shift change with trop pending.   In brief, this patient is a 60 year old female who presents for evaluation of chest pain that began 36 hours prior to ED arrival.  Patient reports that pain does not radiate to arms, neck.  No nausea, vomiting, diaphoresis.  She does not have any history of hypertension.  Chest pain is not worse with exertion.  Please see note from previous provider for full history/physical exam.   Physical Exam  BP (!) 153/92   Pulse 77   Temp 98.3 F (36.8 C) (Oral)   Resp 14   Ht 5\' 6"  (1.676 m)   Wt 122.5 kg   SpO2 99%   BMI 43.58 kg/m   Physical Exam  Well appearing  ED Course/Procedures   Clinical Course as of 02/17/21 1545  Thu Feb 17, 2021  1459 Re-eval: Patient reports feeling well. She has some pressure that is coming intermittently but denies any chest pain, nausea or SOB. Discussed results that have returned thus far. Still waiting on tropinin and BMP. She is requesting to leave but is willing to wait for the results.  [HS]    Clinical Course User Index [HS] Feb 19, 2021, PA-C   Results for orders placed or performed during the hospital encounter of 02/17/21 (from the past 24 hour(s))  Basic metabolic panel     Status: Abnormal   Collection Time: 02/17/21  1:43 PM  Result Value Ref Range   Sodium 139 135 - 145 mmol/L   Potassium 3.4 (L) 3.5 - 5.1 mmol/L   Chloride 103 98 - 111 mmol/L   CO2 27 22 - 32 mmol/L   Glucose, Bld 89 70 - 99 mg/dL   BUN 11 6 - 20 mg/dL   Creatinine, Ser 04/19/21 0.44 - 1.00 mg/dL   Calcium 9.0 8.9 - 4.09 mg/dL   GFR, Estimated 81.1 >91 mL/min   Anion gap 9 5 - 15  CBC     Status: Abnormal   Collection Time: 02/17/21  1:43 PM  Result Value Ref Range   WBC 6.6 4.0 - 10.5 K/uL   RBC 3.75 (L) 3.87 - 5.11 MIL/uL   Hemoglobin 13.3 12.0 - 15.0 g/dL   HCT 04/19/21 82.9 - 56.2 %   MCV 103.7 (H) 80.0 - 100.0 fL   MCH 35.5 (H) 26.0 - 34.0 pg   MCHC 34.2 30.0 - 36.0 g/dL   RDW 13.0 86.5 - 78.4  %   Platelets 215 150 - 400 K/uL   nRBC 0.0 0.0 - 0.2 %  Troponin I (High Sensitivity)     Status: None   Collection Time: 02/17/21  1:43 PM  Result Value Ref Range   Troponin I (High Sensitivity) 4 <18 ng/L  Troponin I (High Sensitivity)     Status: None   Collection Time: 02/17/21  3:15 PM  Result Value Ref Range   Troponin I (High Sensitivity) 5 <18 ng/L   EKG Interpretation  Date/Time:  Thursday February 17 2021 12:47:36 EDT Ventricular Rate:  84 PR Interval:  165 QRS Duration: 106 QT Interval:  387 QTC Calculation: 458 R Axis:   107 Text Interpretation: Sinus rhythm Multiple premature complexes, vent & supraven Right axis deviation Borderline abnrm T, anterolateral leads No old tracing to compare Confirmed by [id number] [phone]) on 02/17/2021 4:42:16 PM    Procedures    PLAN: Patient pending delta trop. If negative, can be d/c home.  MDM:  Delta troponin negative.  Patient  reports that she is currently pain-free.  She is slightly hypertensive here in the ED.  Patient states she is ready to go home.  I discussed with her regarding following up with her primary care doctor regarding hypertension.  Patient will be discharged per previous providers instructions. At this time, patient exhibits no emergent life-threatening condition that require further evaluation in ED. Patient had ample opportunity for questions and discussion. All patient's questions were answered with full understanding. Strict return precautions discussed. Patient expresses understanding and agreement to plan.    1. Nonspecific chest pain   2. Hypertension, unspecified type    Portions of this note were generated with Scientist, clinical (histocompatibility and immunogenetics). Dictation errors may occur despite best attempts at proofreading.    Maxwell Caul, PA-C 02/17/21 1856    Jacalyn Lefevre, MD 02/17/21 2150

## 2021-02-17 NOTE — ED Provider Notes (Signed)
[facility] EMERGENCY DEPARTMENT Provider Note   CSN: [id number] Arrival date & time: 02/17/21  1237     History Chief Complaint  Patient presents with  . 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 female.  HPI  HPI: A 60 year old patient with a history of obesity presents for evaluation of chest pain. Initial onset of pain was approximately 3-6 hours ago. The patient's chest pain is described as heaviness/pressure/tightness and is not worse with exertion. The patient's chest pain is middle- or left-sided, is not well-localized, is not sharp and does radiate to the arms/jaw/neck. The patient does not complain of nausea and denies diaphoresis. The patient has no history of stroke, has no history of peripheral artery disease, has not smoked in the past 90 days, denies any history of treated diabetes, has no relevant family history of coronary artery disease (first degree relative at less than age 36), is not hypertensive and has no history of hypercholesterolemia. Patient presents with sudden onset of chest pain starting at 10 AM.  Occurred when she was working at the [employer].  States that the pain feels like pressure in the middle of her chest.  Also reports numbness and finger tingling bilaterally.  Although the pain and the numbness have resolved, she still feels chest pressure. Patient used to smoke but states she does not anymore. No nausea, no vomiting.  No previous history of blood clots, stroke, MI.  No history of high blood pressure, or history of  diabetes.  Past Medical History:  Diagnosis Date  . GERD (gastroesophageal reflux disease)     Patient Active Problem List   Diagnosis Date Noted  . Vitamin D insufficiency 06/17/2016  . BPPV (benign paroxysmal positional vertigo) 05/31/2016  . BMI 36.0-36.9,adult 05/19/2016  . h/o Common wart- legs 05/19/2016  . Tobacco use disorder 05/19/2016  . Tobacco abuse counseling 05/19/2016    Past Surgical History:  Procedure Laterality Date  . CESAREAN SECTION       OB History   No obstetric history on file.     Family History  Problem Relation Age of Onset  . Hypertension Mother   . Neuropathy Mother   . Heart disease Mother 48       PM  . Cancer Father 38       " abdominal cancer"  . Alcohol abuse Sister   . Hypertension Sister 56  . Diabetes Brother 45       type II  . Diabetes Maternal Grandmother   . Cancer Maternal Grandmother        pancreatic  . Drug abuse Son     Social History   Tobacco Use  . Smoking status: Current Some Day Smoker    Packs/day: 0.01    Years: 35.00    Pack years: 0.35  . Smokeless tobacco: Never Used  Substance Use Topics  . Alcohol use: Yes    Alcohol/week: 18.0 standard drinks    Types: 18 Cans of beer per week  Comment: drinks on weekends only  . Drug use: No    Home Medications Prior to Admission medications   Medication Sig Start Date End Date Taking? Authorizing Provider  ibuprofen (ADVIL) 200 MG tablet Take 400 mg by mouth daily. Knee pain   Yes [provider]  Lysine 1000 MG TABS Take 1 tablet by mouth daily as needed (fever blisters).   Yes [provider]    Allergies    Patient has no known allergies.  Review of Systems   Review of Systems  Constitutional: Negative for chills and fever.  HENT: Negative for ear pain and sore throat.   Eyes: Negative for pain and visual disturbance.  Respiratory: Negative for cough and shortness of breath.   Cardiovascular: Positive for chest pain.  Negative for palpitations and leg swelling.  Gastrointestinal: Negative for abdominal pain and vomiting.  Genitourinary: Negative for dysuria and hematuria.  Musculoskeletal: Negative for arthralgias and back pain.  Skin: Negative for color change and rash.  Neurological: Positive for numbness. Negative for dizziness, seizures and syncope.  All other systems reviewed and are negative.   Physical Exam Updated Vital Signs BP (!) 166/88   Pulse 82   Temp 98.3 F (36.8 C) (Oral)   Resp 16   Ht 5\' 6"  (1.676 m)   Wt 122.5 kg   SpO2 100%   BMI 43.58 kg/m   Physical Exam Vitals and nursing note reviewed. Exam conducted with a chaperone present.  Constitutional:      Appearance: Normal appearance. She is obese.  HENT:     Head: Normocephalic and atraumatic.  Eyes:     General: No scleral icterus.       Right eye: No discharge.        Left eye: No discharge.     Extraocular Movements: Extraocular movements intact.     Pupils: Pupils are equal, round, and reactive to light.  Cardiovascular:     Rate and Rhythm: Normal rate and regular rhythm.     Pulses: Normal pulses.     Heart sounds: Normal heart sounds. No murmur heard. No friction rub. No gallop.      Comments: No chest tenderness.  Pulmonary:     Effort: Pulmonary effort is normal. No respiratory distress.     Breath sounds: Normal breath sounds.     Comments: Speaking in complete sentences. Lungs CTA bilatearlly Abdominal:     General: Abdomen is flat. Bowel sounds are normal. There is no distension.     Palpations: Abdomen is soft.     Tenderness: There is no abdominal tenderness.  Musculoskeletal:     Right lower leg: No edema.     Left lower leg: No edema.  Skin:    General: Skin is warm and dry.     Coloration: Skin is not jaundiced.  Neurological:     General: No focal deficit present.     Mental Status: She is alert and oriented to person, place, and time. Mental status is at baseline.     Cranial Nerves: No  cranial nerve deficit.     Motor: No weakness.     Coordination: Coordination normal.     Comments: CN III-XII grossly intact. Grip strength equal bilaterally.      ED Results / Procedures / Treatments   Labs (all labs ordered are listed, but only abnormal results are displayed) Labs Reviewed  BASIC METABOLIC PANEL - Abnormal; Notable for the following components:      Result Value  Potassium 3.4 (*)    All other components within normal limits  CBC - Abnormal; Notable for the following components:   RBC 3.75 (*)    MCV 103.7 (*)    MCH 35.5 (*)    All other components within normal limits  TROPONIN I (HIGH SENSITIVITY)  TROPONIN I (HIGH SENSITIVITY)    EKG None  Radiology DG Chest Port 1 View  Result Date: 02/17/2021 CLINICAL DATA:  Chest pain EXAM: PORTABLE CHEST 1 VIEW COMPARISON:  August 11, 2020 FINDINGS: Lungs are clear. Heart size and pulmonary vascularity are normal. No adenopathy. No pneumothorax. No bone lesions. IMPRESSION: Lungs clear.  Cardiac silhouette within normal limits. Electronically Signed   By: Bretta Bang III M.D.   On: 02/17/2021 13:44    Procedures Procedures   Medications Ordered in ED Medications - No data to display  ED Course  I have reviewed the triage vital signs and the nursing notes.  Pertinent labs & imaging results that were available during my care of the patient were reviewed by me and considered in my medical decision making (see chart for details).  Clinical Course as of 02/17/21 1527  Thu Feb 17, 2021  1459 Re-eval: Patient reports feeling well. She has some pressure that is coming intermittently but denies any chest pain, nausea or SOB. Discussed results that have returned thus far. Still waiting on tropinin and BMP. She is requesting to leave but is willing to wait for the results.  [HS]    Clinical Course User Index [HS] Theron Arista, PA-C   MDM Rules/Calculators/A&P HEAR Score: 3                        Patient is  a 60 year old female presenting with chest pain.  Her vitals are stable are all stable although she is hypertensive.  Patient is not in any acute distress in the room.  Will initiate chest pain workup.   Differential diagnosis includes but is not limited to ACS, pericarditis, PNA, GERD, esophageal spasm, pneumonia, musculoskeletal pain, anxiety.  I doubt she is having any mesenteric ischemia given age, lack of risk factors, overall presentation.  Very very low suspicion for acute dissection given age, lack of pain towards the back, lack of history of hypertension, nature of the pain described. Doubt PE given not tachycardic, no recent surgeries, no recent travel, not on oral birthcontrol.    Labs thus far been reassuring.  Chest x-ray shows no sign of pneumonia, pneumothorax, cardiomegaly, acute pulmonary disease.  EKG is not concerning for ACS for ST elevation ACS.  CBC shows no sign of anemia, elevated white blood cell count concerning for infectious etiology. Based on workup, I have very low suspicion for emergent disease. Based on her heart score it recommended that she have a second troponin. Pending troponin results patient will likely be able to return home and have close follow-up with PCP.  Return precautions have been given.  Patient is agreeable to this plan.   Final Clinical Impression(s) / ED Diagnoses Final diagnoses:  None    Rx / DC Orders ED Discharge Orders    None       Theron Arista, PA-C 02/21/21 6789    Rolan Bucco, MD 02/28/21 2316

## 2021-02-17 NOTE — Discharge Instructions (Addendum)
As we discussed, your work-up today was reassuring.  Your blood pressure today was slightly high.  As we discussed, this needs to be evaluated by your primary care doctor.  Please have them reach check and reevaluate.  Return to the emergency department for any chest pain, difficulty breathing or any other worsening concerning symptoms.

## 2021-02-17 NOTE — ED Triage Notes (Addendum)
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

## 2021-03-02 ENCOUNTER — Inpatient Hospital Stay: Payer: BLUE CROSS/BLUE SHIELD | Admitting: Nurse Practitioner

## 2021-03-03 ENCOUNTER — Inpatient Hospital Stay: Payer: BLUE CROSS/BLUE SHIELD | Admitting: Nurse Practitioner

## 2021-03-10 ENCOUNTER — Other Ambulatory Visit: Payer: Self-pay

## 2021-03-10 ENCOUNTER — Ambulatory Visit (INDEPENDENT_AMBULATORY_CARE_PROVIDER_SITE_OTHER): Payer: BLUE CROSS/BLUE SHIELD | Admitting: Nurse Practitioner

## 2021-03-10 ENCOUNTER — Encounter: Payer: Self-pay | Admitting: Nurse Practitioner

## 2021-03-10 VITALS — BP 145/83 | HR 82 | Temp 97.8°F | Ht 66.0 in | Wt 268.6 lb

## 2021-03-10 DIAGNOSIS — Z0000 Encounter for general adult medical examination without abnormal findings: Secondary | ICD-10-CM | POA: Diagnosis not present

## 2021-03-10 DIAGNOSIS — Z6841 Body Mass Index (BMI) 40.0 and over, adult: Secondary | ICD-10-CM

## 2021-03-10 DIAGNOSIS — R5383 Other fatigue: Secondary | ICD-10-CM

## 2021-03-10 DIAGNOSIS — R079 Chest pain, unspecified: Secondary | ICD-10-CM

## 2021-03-10 DIAGNOSIS — Z1231 Encounter for screening mammogram for malignant neoplasm of breast: Secondary | ICD-10-CM

## 2021-03-10 DIAGNOSIS — E876 Hypokalemia: Secondary | ICD-10-CM

## 2021-03-10 DIAGNOSIS — R635 Abnormal weight gain: Secondary | ICD-10-CM | POA: Diagnosis not present

## 2021-03-10 DIAGNOSIS — I493 Ventricular premature depolarization: Secondary | ICD-10-CM

## 2021-03-10 NOTE — Progress Notes (Signed)
Established Patient Office Visit  Subjective:  Patient ID: Angela Long, female    DOB: 1960-11-18  Age: 60 y.o. MRN: [id number]  CC:  Chief Complaint  Patient presents with   Hospitalization Follow-up    HPI [name] presents for follow-up of recent visit to the emergency department.  She states that while at work, she felt heavy in her chest, her fingers were tingling.  Initially thought she was dehydrated or blood sugar had dropped.  She tried drinking water.  She ate a snack.  She tried to go back to work.  States she still felt bad.  Employer called 911.  She was taken to the hospital where she was evaluated in the emergency department.  An EKG was done which showed preventricular contractions.  There was also an abnormal rhythm.  Labs indicated she had a mildly low potassium level.  She had normal serial troponins.  This visit was on 02/17/2021.  No new medications were started.  She has not had any further problems since the visit to the emergency department. She is due for a routine physical with Pap smear.  She needs to have routine fasting labs.  She needs to have mammogram.  She states she has not been to a medical provider in some time.  She states that she is generally healthy and has not felt the need to visit her primary care provider. She currently denies physical concerns or complaints. She denies chest pain, chest pressure, or shortness of breath. He denies headaches or visual disturbances. He denies abdominal pain, nausea, vomiting, or changes in bowel or bladder habits.    Past Medical History:  Diagnosis Date   GERD (gastroesophageal reflux disease)     Past Surgical History:  Procedure Laterality Date   CESAREAN SECTION      Family History  Problem Relation Age of Onset   Hypertension Mother    Neuropathy Mother    Heart disease Mother 22       PM   Cancer Father 28       " abdominal cancer"   Alcohol abuse Sister    Hypertension Sister 84   Diabetes  Brother 65       type II   Diabetes Maternal Grandmother    Cancer Maternal Grandmother        pancreatic   Drug abuse Son     Social History   Socioeconomic History   Marital status: Married    Spouse name: Not on file   Number of children: Not on file   Years of education: Not on file   Highest education level: Not on file  Occupational History   Not on file  Tobacco Use   Smoking status: Former    Packs/day: 0.01    Years: 35.00    Pack years: 0.35    Types: Cigarettes   Smokeless tobacco: Never  Substance and Sexual Activity   Alcohol use: Yes    Alcohol/week: 18.0 standard drinks    Types: 18 Cans of beer per week    Comment: drinks on weekends only   Drug use: No   Sexual activity: Yes  Other Topics Concern   Not on file  Social History Narrative   Not on file   Social Determinants of Health   Financial Resource Strain: Not on file  Food Insecurity: Not on file  Transportation Needs: Not on file  Physical Activity: Not on file  Stress: Not on file  Social Connections:  Not on file  Intimate Partner Violence: Not on file    Outpatient Medications Prior to Visit  Medication Sig Dispense Refill   ibuprofen (ADVIL) 200 MG tablet Take 400 mg by mouth daily. Knee pain     Lysine 1000 MG TABS Take 1 tablet by mouth daily as needed (fever blisters).     No facility-administered medications prior to visit.    No Known Allergies  ROS Review of Systems  Constitutional:  Positive for fatigue. Negative for activity change, chills and fever.  HENT:  Negative for congestion, rhinorrhea, sinus pressure and sinus pain.   Eyes: Negative.   Respiratory:  Negative for cough, chest tightness and wheezing.   Cardiovascular:  Negative for chest pain and palpitations.       Single episode earlier this month of palpitations, heavy feeling in the chest, and tingling in the fingers.   Gastrointestinal:  Negative for constipation, diarrhea, nausea and vomiting.   Endocrine: Negative for cold intolerance, heat intolerance, polydipsia and polyuria.  Musculoskeletal:  Negative for arthralgias, back pain and myalgias.  Skin:  Negative for rash.  Allergic/Immunologic: Negative for environmental allergies.  Neurological:  Negative for dizziness, weakness and headaches.  Psychiatric/Behavioral:  Negative for dysphoric mood and sleep disturbance. The patient is not nervous/anxious.      Objective:    Physical Exam Vitals and nursing note reviewed.  Constitutional:      Appearance: Normal appearance. She is well-developed. She is obese.  HENT:     Head: Normocephalic and atraumatic.     Nose: Nose normal.     Mouth/Throat:     Mouth: Mucous membranes are moist.  Eyes:     Extraocular Movements: Extraocular movements intact.     Conjunctiva/sclera: Conjunctivae normal.     Pupils: Pupils are equal, round, and reactive to light.  Cardiovascular:     Rate and Rhythm: Normal rate and regular rhythm.     Pulses: Normal pulses.     Heart sounds: Normal heart sounds.  Pulmonary:     Effort: Pulmonary effort is normal.     Breath sounds: Normal breath sounds.  Abdominal:     Palpations: Abdomen is soft.  Musculoskeletal:        General: Normal range of motion.     Cervical back: Normal range of motion and neck supple.  Lymphadenopathy:     Cervical: No cervical adenopathy.  Skin:    General: Skin is warm and dry.     Capillary Refill: Capillary refill takes less than 2 seconds.  Neurological:     General: No focal deficit present.     Mental Status: She is alert and oriented to person, place, and time.  Psychiatric:        Mood and Affect: Mood normal.        Behavior: Behavior normal.        Thought Content: Thought content normal.        Judgment: Judgment normal.    Today's Vitals   03/10/21 0917  BP: (!) 145/83  Pulse: 82  Temp: 97.8 F (36.6 C)  SpO2: 97%  Weight: 268 lb 9.6 oz (121.8 kg)  Height: _0  (1.676 m)   Body mass  index is 43.35 kg/m.   Wt Readings from Last 3 Encounters:  03/10/21 268 lb 9.6 oz (121.8 kg)  02/17/21 270 lb (122.5 kg)  06/13/16 227 lb (103 kg)     Health Maintenance Due  Topic Date Due   COVID-19 Vaccine (1) Never  done   TETANUS/TDAP  Never done   PAP SMEAR-Modifier  Never done   COLONOSCOPY (Pts 45-22yr Insurance coverage will need to be confirmed)  Never done   MAMMOGRAM  Never done   Zoster Vaccines- Shingrix (1 of 2) Never done    There are no preventive care reminders to display for this patient.  Lab Results  Component Value Date   TSH 4.000 03/10/2021   Lab Results  Component Value Date   WBC 6.5 03/10/2021   HGB 14.4 03/10/2021   HCT 41.2 03/10/2021   MCV 101 (H) 03/10/2021   PLT 233 03/10/2021   Lab Results  Component Value Date   NA 141 03/10/2021   K 4.1 03/10/2021   CO2 23 03/10/2021   GLUCOSE 84 03/10/2021   BUN 10 03/10/2021   CREATININE 0.57 03/10/2021   BILITOT 0.8 03/10/2021   ALKPHOS 114 03/10/2021   AST 25 03/10/2021   ALT 20 03/10/2021   PROT 7.3 03/10/2021   ALBUMIN 4.3 03/10/2021   CALCIUM 9.6 03/10/2021   ANIONGAP 9 02/17/2021   EGFR 105 03/10/2021   Lab Results  Component Value Date   CHOL 222 (H) 03/10/2021   Lab Results  Component Value Date   HDL 68 03/10/2021   Lab Results  Component Value Date   LDLCALC 145 (H) 03/10/2021   Lab Results  Component Value Date   TRIG 55 03/10/2021   Lab Results  Component Value Date   CHOLHDL 3.3 03/10/2021   Lab Results  Component Value Date   HGBA1C 4.7 05/19/2016      Assessment & Plan:  1. Chest pain, unspecified type Trip to emergency department 02/17/2021.  Reviewed EKG and labs.  Serial troponins were normal.  EKG showing sinus rhythm with multiple PVCs.  Chest pain resolved on its own.  No further episodes.  Will monitor.  2. Symptomatic PVCs EKG in emergency department showing sinus rhythm with multiple PVCs.  There was right axis deviation, and borderline  abnormal T in anterolateral leads.  No further episodes.  Will monitor.  3. Hypokalemia Potassium in the emergency department 3.4.  Currently taking oral potassium supplement.  Will redraw CMP to check potassium level.  Continue current discontinue treatment as indicated. - Comprehensive metabolic panel  4. Other fatigue Labs drawn during today's visit include CBC, CMP, and thyroid panel.  Will discuss with patient when results are available. - CBC with Differential/Platelet - Comprehensive metabolic panel - T4, free - TSH  5. Abnormal weight gain Check thyroid panel and fasting lipid panel. - T4, free - TSH - Lipid panel  6. Body mass index (BMI) of 45.0-49.9 in adult ([facility] Discussed limiting calorie intake to 1500 cal/day and incorporating physical activity into daily routine.  7. Encounter for screening mammogram for malignant neoplasm of breast Screening mammogram ordered today. - MM DIGITAL SCREENING BILATERAL; Future  8. Healthcare maintenance Routine fasting labs were drawn during today's visit. - CBC with Differential/Platelet - Comprehensive metabolic panel - T4, free - TSH - Lipid panel   Problem List Items Addressed This Visit       Cardiovascular and Mediastinum   Symptomatic PVCs     Other   Chest pain - Primary   Hypokalemia   Relevant Orders   Comprehensive metabolic panel (Completed)   Other fatigue   Relevant Orders   CBC with Differential/Platelet (Completed)   Comprehensive metabolic panel (Completed)   T4, free (Completed)   TSH (Completed)   Abnormal weight gain  Relevant Orders   T4, free (Completed)   TSH (Completed)   Lipid panel (Completed)   Body mass index (BMI) of 45.0-49.9 in adult [facility])   Encounter for screening mammogram for malignant neoplasm of breast   Relevant Orders   MM Payne maintenance   Relevant Orders   CBC with Differential/Platelet (Completed)   Comprehensive metabolic panel  (Completed)   T4, free (Completed)   TSH (Completed)   Lipid panel (Completed)    Follow-up: Return in about 6 weeks (around 04/21/2021) for health maintenance exam with pap .    Ronnell Freshwater, NP

## 2021-03-11 LAB — CBC WITH DIFFERENTIAL/PLATELET
Basophils Absolute: 0 10*3/uL (ref 0.0–0.2)
Basos: 1 %
EOS (ABSOLUTE): 0.1 10*3/uL (ref 0.0–0.4)
Eos: 2 %
Hematocrit: 41.2 % (ref 34.0–46.6)
Hemoglobin: 14.4 g/dL (ref 11.1–15.9)
Immature Grans (Abs): 0 10*3/uL (ref 0.0–0.1)
Immature Granulocytes: 0 %
Lymphocytes Absolute: 1.4 10*3/uL (ref 0.7–3.1)
Lymphs: 22 %
MCH: 35.3 pg — ABNORMAL HIGH (ref 26.6–33.0)
MCHC: 35 g/dL (ref 31.5–35.7)
MCV: 101 fL — ABNORMAL HIGH (ref 79–97)
Monocytes Absolute: 0.6 10*3/uL (ref 0.1–0.9)
Monocytes: 9 %
Neutrophils Absolute: 4.3 10*3/uL (ref 1.4–7.0)
Neutrophils: 66 %
Platelets: 233 10*3/uL (ref 150–450)
RBC: 4.08 x10E6/uL (ref 3.77–5.28)
RDW: 12.6 % (ref 11.7–15.4)
WBC: 6.5 10*3/uL (ref 3.4–10.8)

## 2021-03-11 LAB — COMPREHENSIVE METABOLIC PANEL
ALT: 20 IU/L (ref 0–32)
AST: 25 IU/L (ref 0–40)
Albumin/Globulin Ratio: 1.4 (ref 1.2–2.2)
Albumin: 4.3 g/dL (ref 3.8–4.9)
Alkaline Phosphatase: 114 IU/L (ref 44–121)
BUN/Creatinine Ratio: 18 (ref 9–23)
BUN: 10 mg/dL (ref 6–24)
Bilirubin Total: 0.8 mg/dL (ref 0.0–1.2)
CO2: 23 mmol/L (ref 20–29)
Calcium: 9.6 mg/dL (ref 8.7–10.2)
Chloride: 101 mmol/L (ref 96–106)
Creatinine, Ser: 0.57 mg/dL (ref 0.57–1.00)
Globulin, Total: 3 g/dL (ref 1.5–4.5)
Glucose: 84 mg/dL (ref 65–99)
Potassium: 4.1 mmol/L (ref 3.5–5.2)
Sodium: 141 mmol/L (ref 134–144)
Total Protein: 7.3 g/dL (ref 6.0–8.5)
eGFR: 105 mL/min/{1.73_m2} (ref >59)

## 2021-03-11 LAB — LIPID PANEL
Chol/HDL Ratio: 3.3 ratio (ref 0.0–4.4)
Cholesterol, Total: 222 mg/dL — ABNORMAL HIGH (ref 100–199)
HDL: 68 mg/dL (ref >39)
LDL Chol Calc (NIH): 145 mg/dL — ABNORMAL HIGH (ref 0–99)
Triglycerides: 55 mg/dL (ref 0–149)
VLDL Cholesterol Cal: 9 mg/dL (ref 5–40)

## 2021-03-11 LAB — T4, FREE: Free T4: 1.08 ng/dL (ref 0.82–1.77)

## 2021-03-11 LAB — TSH: TSH: 4 u[IU]/mL (ref 0.450–4.500)

## 2021-03-15 NOTE — Progress Notes (Signed)
Please let the patient know that I have her labs back. Her bad and total cholesterol levels are both a little high. She should limit fried and fatty foods in her diet and increase her physical activity. Her other labs are good and we will review them in more detail at her next visit. Thanks.

## 2021-03-20 DIAGNOSIS — I493 Ventricular premature depolarization: Secondary | ICD-10-CM | POA: Insufficient documentation

## 2021-03-20 DIAGNOSIS — R5383 Other fatigue: Secondary | ICD-10-CM | POA: Insufficient documentation

## 2021-03-20 DIAGNOSIS — R079 Chest pain, unspecified: Secondary | ICD-10-CM | POA: Insufficient documentation

## 2021-03-20 DIAGNOSIS — Z1231 Encounter for screening mammogram for malignant neoplasm of breast: Secondary | ICD-10-CM | POA: Insufficient documentation

## 2021-03-20 DIAGNOSIS — Z6841 Body Mass Index (BMI) 40.0 and over, adult: Secondary | ICD-10-CM | POA: Insufficient documentation

## 2021-03-20 DIAGNOSIS — Z0000 Encounter for general adult medical examination without abnormal findings: Secondary | ICD-10-CM | POA: Insufficient documentation

## 2021-03-20 DIAGNOSIS — R635 Abnormal weight gain: Secondary | ICD-10-CM | POA: Insufficient documentation

## 2021-03-20 DIAGNOSIS — E876 Hypokalemia: Secondary | ICD-10-CM | POA: Insufficient documentation

## 2021-04-28 ENCOUNTER — Encounter: Payer: BLUE CROSS/BLUE SHIELD | Admitting: Nurse Practitioner

## 2021-05-07 DIAGNOSIS — U071 COVID-19: Secondary | ICD-10-CM | POA: Diagnosis not present

## 2022-01-03 ENCOUNTER — Encounter: Payer: Self-pay | Admitting: Physician Assistant

## 2022-01-03 ENCOUNTER — Ambulatory Visit (INDEPENDENT_AMBULATORY_CARE_PROVIDER_SITE_OTHER): Payer: BC Managed Care – PPO | Admitting: Physician Assistant

## 2022-01-03 VITALS — BP 120/86 | HR 90 | Temp 98.0°F | Ht 66.0 in | Wt 258.0 lb

## 2022-01-03 DIAGNOSIS — R140 Abdominal distension (gaseous): Secondary | ICD-10-CM | POA: Diagnosis not present

## 2022-01-03 DIAGNOSIS — R1010 Upper abdominal pain, unspecified: Secondary | ICD-10-CM

## 2022-01-03 NOTE — Patient Instructions (Signed)
Abdominal Pain, Adult Many things can cause belly (abdominal) pain. Most times, belly pain is not dangerous. Many cases of belly pain can be watched and treated at home. Sometimes, though, belly pain is serious. Your doctor will try to find the cause of your belly pain. Follow these instructions at home:  Medicines Take over-the-counter and prescription medicines only as told by your doctor. Do not take medicines that help you poop (laxatives) unless told by your doctor. General instructions Watch your belly pain for any changes. Drink enough fluid to keep your pee (urine) pale yellow. Keep all follow-up visits as told by your doctor. This is important. Contact a doctor if: Your belly pain changes or gets worse. You are not hungry, or you lose weight without trying. You are having trouble pooping (constipated) or have watery poop (diarrhea) for more than 2-3 days. You have pain when you pee or poop. Your belly pain wakes you up at night. Your pain gets worse with meals, after eating, or with certain foods. You are vomiting and cannot keep anything down. You have a fever. You have blood in your pee. Get help right away if: Your pain does not go away as soon as your doctor says it should. You cannot stop vomiting. Your pain is only in areas of your belly, such as the right side or the left lower part of the belly. You have bloody or black poop, or poop that looks like tar. You have very bad pain, cramping, or bloating in your belly. You have signs of not having enough fluid or water in your body (dehydration), such as: Dark pee, very little pee, or no pee. Cracked lips. Dry mouth. Sunken eyes. Sleepiness. Weakness. You have trouble breathing or chest pain. Summary Many cases of belly pain can be watched and treated at home. Watch your belly pain for any changes. Take over-the-counter and prescription medicines only as told by your doctor. Contact a doctor if your belly pain  changes or gets worse. Get help right away if you have very bad pain, cramping, or bloating in your belly. This information is not intended to replace advice given to you by your health care provider. Make sure you discuss any questions you have with your health care provider. Document Revised: 01/13/2019 Document Reviewed: 01/13/2019 Elsevier Patient Education  2023 Elsevier Inc.  

## 2022-01-03 NOTE — Progress Notes (Signed)
?  Established patient acute visit ? ? ?Patient: Angela Long   DOB: [birth year]   61 y.o. Female  MRN: [id number] ?Visit Date: 01/03/2022 ? ?Chief Complaint  ?Patient presents with  ? Acute Visit  ? ?Subjective  ?  ?HPI  ?Patient presents with c/o abdominal pain. States 1 month ago had a stomach virus with significant vomiting, since then has been feeling bloated at times and also having pain of right upper abdomen which sometimes radiates to her back. Pain onset can be after meals or occur out of nowhere. Nausea and vomiting have resolved. Denies changes with stool or bowel movements. No fever, chest pain, palpitations, tarry or bloody stools. Has noticed more belching than normal.  ? ? ? ?Medications: ?Outpatient Medications Prior to Visit  ?Medication Sig  ? ibuprofen (ADVIL) 200 MG tablet Take 400 mg by mouth daily. Knee pain  ? Lysine 1000 MG TABS Take 1 tablet by mouth daily as needed (fever blisters).  ? ?No facility-administered medications prior to visit.  ? ? ?Review of Systems ?Review of Systems:  ?A fourteen system review of systems was performed and found to be positive as per HPI. ? ? ?  Objective  ?  ?BP 120/86   Pulse 90   Temp 98 ?F (36.7 ?C)   Ht 5\' 6"  (1.676 m)   Wt 258 lb (117 kg)   SpO2 98%   BMI 41.64 kg/m?  ? ? ?Physical Exam  ?General:  Well Developed, well nourished, appropriate for stated age.  ?Neuro:  Alert and oriented,  extra-ocular muscles intact  ?HEENT:  Normocephalic, atraumatic, neck supple  ?Skin:  no gross rash, warm, pink. ?Chest: no chest wall tenderness, normal excursion  ?Abdomen: +BS, mild distension noted, tenderness of right upper and lower abdomen with palpation, negative Murphy's and Rovsing's sign, no rebound tenderness, no guarding    ?Cardiac:  RRR, S1 S2 ?Respiratory: CTA B/L  ?Vascular:  Ext warm, no cyanosis apprec.; cap RF less 2 sec. ?Psych:  No HI/SI, judgement and insight good, Euthymic mood. Full Affect. ? ? ?No results found for any visits on 01/03/22. ?  Assessment & Plan  ?  ? ?Etiology unclear. DDX: gallbladder disease, biliary colic, pancreatitis, IBD, colitis, nephrolithiasis. UA collected which is positive for trace of ketones, small ketones and trace of blood. Nitrites and leukocytes are negative so less likely UTI. Will collect labs to evaluate for leukocytosis, liver function, electrolyte imbalance or signs of pancreatitis. Discussed with patient obtaining imaging studies, declined CT of the abdomen and pelvis w/ contrast but agreeable to abdomen ultrasound. If symptoms become severe recommend seeking immediate medical care.  ? ? ?Return if symptoms worsen or fail to improve/pending 01/05/22.  ?   ? ? ? ?[place], PA-C  ?[facility] ?[phone] (phone) ?[phone] (fax) ? ?[facility] ?

## 2022-01-04 LAB — COMPREHENSIVE METABOLIC PANEL
ALT: 18 IU/L (ref 0–32)
AST: 26 IU/L (ref 0–40)
Albumin/Globulin Ratio: 1.5 (ref 1.2–2.2)
Albumin: 4.6 g/dL (ref 3.8–4.9)
Alkaline Phosphatase: 123 IU/L — ABNORMAL HIGH (ref 44–121)
BUN/Creatinine Ratio: 19 (ref 12–28)
BUN: 13 mg/dL (ref 8–27)
Bilirubin Total: 0.7 mg/dL (ref 0.0–1.2)
CO2: 21 mmol/L (ref 20–29)
Calcium: 9.7 mg/dL (ref 8.7–10.3)
Chloride: 101 mmol/L (ref 96–106)
Creatinine, Ser: 0.69 mg/dL (ref 0.57–1.00)
Globulin, Total: 3.1 g/dL (ref 1.5–4.5)
Sodium: 143 mmol/L (ref 134–144)
Total Protein: 7.7 g/dL (ref 6.0–8.5)
eGFR: 99 mL/min/{1.73_m2} (ref >59)

## 2022-01-04 LAB — POCT URINALYSIS DIPSTICK
Glucose, UA: NEGATIVE
Leukocytes, UA: NEGATIVE
Nitrite, UA: NEGATIVE
Protein, UA: POSITIVE — AB
Spec Grav, UA: >=1.03 — AB (ref 1.010–1.025)
Urobilinogen, UA: 2 E.U./dL — AB
pH, UA: 6.5 (ref 5.0–8.0)

## 2022-01-04 LAB — CBC WITH DIFFERENTIAL/PLATELET
Basophils Absolute: 0.1 10*3/uL (ref 0.0–0.2)
Basos: 1 %
EOS (ABSOLUTE): 0.1 10*3/uL (ref 0.0–0.4)
Eos: 1 %
Hematocrit: 42.1 % (ref 34.0–46.6)
Hemoglobin: 15.2 g/dL (ref 11.1–15.9)
Immature Grans (Abs): 0 10*3/uL (ref 0.0–0.1)
Immature Granulocytes: 0 %
Lymphocytes Absolute: 2 10*3/uL (ref 0.7–3.1)
Lymphs: 26 %
MCH: 37.1 pg — ABNORMAL HIGH (ref 26.6–33.0)
MCHC: 36.1 g/dL — ABNORMAL HIGH (ref 31.5–35.7)
MCV: 103 fL — ABNORMAL HIGH (ref 79–97)
Monocytes Absolute: 0.7 10*3/uL (ref 0.1–0.9)
Monocytes: 9 %
Neutrophils Absolute: 4.7 10*3/uL (ref 1.4–7.0)
Neutrophils: 63 %
Platelets: 222 10*3/uL (ref 150–450)
RBC: 4.1 x10E6/uL (ref 3.77–5.28)
RDW: 12.6 % (ref 11.7–15.4)
WBC: 7.5 10*3/uL (ref 3.4–10.8)

## 2022-01-04 LAB — LIPASE: Lipase: 19 U/L (ref 14–72)

## 2022-01-10 ENCOUNTER — Ambulatory Visit
Admission: RE | Admit: 2022-01-10 | Discharge: 2022-01-10 | Disposition: A | Discharge status: Home / Self Care | Payer: BC Managed Care – PPO | Source: Ambulatory Visit | Attending: Physician Assistant | Admitting: Physician Assistant

## 2022-01-10 ENCOUNTER — Other Ambulatory Visit: Payer: Self-pay

## 2022-01-10 DIAGNOSIS — R1010 Upper abdominal pain, unspecified: Secondary | ICD-10-CM

## 2022-01-10 DIAGNOSIS — R140 Abdominal distension (gaseous): Secondary | ICD-10-CM

## 2022-01-10 DIAGNOSIS — R935 Abnormal findings on diagnostic imaging of other abdominal regions, including retroperitoneum: Secondary | ICD-10-CM

## 2022-01-10 DIAGNOSIS — R109 Unspecified abdominal pain: Secondary | ICD-10-CM | POA: Diagnosis not present

## 2022-01-11 ENCOUNTER — Telehealth (INDEPENDENT_AMBULATORY_CARE_PROVIDER_SITE_OTHER): Payer: BC Managed Care – PPO | Admitting: Nurse Practitioner

## 2022-01-11 ENCOUNTER — Encounter: Payer: Self-pay | Admitting: Nurse Practitioner

## 2022-01-11 DIAGNOSIS — R935 Abnormal findings on diagnostic imaging of other abdominal regions, including retroperitoneum: Secondary | ICD-10-CM | POA: Diagnosis not present

## 2022-01-11 DIAGNOSIS — R1010 Upper abdominal pain, unspecified: Secondary | ICD-10-CM

## 2022-01-11 NOTE — Progress Notes (Signed)
Virtual Visit via Telephone Note ? ?I connected with Angela Long on 01/16/22 at  3:10 PM EDT by telephone and verified that I am speaking with the correct person using two identifiers. ? ?Location: ?Patient: home ?Provider: [place] [facility]   ?  ?I discussed the limitations, risks, security and privacy concerns of performing an evaluation and management service by telephone and the availability of in person appointments. I also discussed with the patient that there may be a patient responsible charge related to this service. The patient expressed understanding and agreed to proceed. ? ? ?History of Present Illness: ?Patient was seen 01/03/2022. Had been feeling bloated at times and having pain of right upper abdomen which sometimes radiated to her back. Pain onset could be after meals or occur out of nowhere. She had ultrasound of the abdomen yesterday. Patient presents to review ultrasound results. Results are as follows: Coarse, increased echogenicity of the liver parenchyma suggesting hepatic steatosis and/or other hepatocellular disease. The sonographer did note positive Murphy's sign. The study was noted to be difficult due to the patient's body habitus. She has already been referred to GI provider for further evaluation.  ?  ?Observations/Objective: ? ?The patient is alert and oriented. She is pleasant and answers all questions appropriately. Breathing is non-labored. She is in no acute distress at this time.   ? ?Assessment and Plan: ?1. Pain of upper abdomen ?The patient is reporting little improvement in level of pain in abdomen. We reviewed the results of her ultrasound. A referral has already been made to GI for further evaluation.  ? ?2. Abnormal [place] (ultrasound) of abdomen ?Recent abdominal ultrasound showed coarse, increased echogenicity of the liver parenchyma suggesting hepatic steatosis and/or other hepatocellular disease. There was presence of Murphy's sign. The patient has  been referred to GI provider for further evaluation.  ? ?Follow Up Instructions: ? ?  ?I discussed the assessment and treatment plan with the patient. The patient was provided an opportunity to ask questions and all were answered. The patient agreed with the plan and demonstrated an understanding of the instructions. ?  ?The patient was advised to call back or seek an in-person evaluation if the symptoms worsen or if the condition fails to improve as anticipated. ? ?I provided 20 minutes of non-face-to-face time during this encounter. ? ? ?Carlean Jews, NP  ?

## 2022-01-16 ENCOUNTER — Telehealth: Payer: Self-pay | Admitting: Nurse Practitioner

## 2022-01-16 DIAGNOSIS — R1010 Upper abdominal pain, unspecified: Secondary | ICD-10-CM | POA: Insufficient documentation

## 2022-01-16 DIAGNOSIS — R935 Abnormal findings on diagnostic imaging of other abdominal regions, including retroperitoneum: Secondary | ICD-10-CM | POA: Insufficient documentation

## 2022-01-16 NOTE — Telephone Encounter (Signed)
Can you give the patient the referral information? She was referred by Kandis Cocking to [facility]. Referral has been approved and ready for scheduling. Not sure if this appointment has already been made.

## 2022-01-16 NOTE — Telephone Encounter (Signed)
No, upon review of records from [place], patient was advised she was being referred to [facility]. AS, CMA ?

## 2022-01-16 NOTE — Telephone Encounter (Signed)
Patient is wanting to know if you were going to send her for a CT due to the ultrasound results or should she just wait to see GI? ?

## 2022-01-16 NOTE — Telephone Encounter (Signed)
Patient is aware 

## 2022-01-17 NOTE — Telephone Encounter (Signed)
Patient is aware 

## 2022-02-09 ENCOUNTER — Other Ambulatory Visit (INDEPENDENT_AMBULATORY_CARE_PROVIDER_SITE_OTHER): Payer: BC Managed Care – PPO

## 2022-02-09 ENCOUNTER — Encounter: Payer: Self-pay | Admitting: Gastroenterology

## 2022-02-09 ENCOUNTER — Ambulatory Visit (INDEPENDENT_AMBULATORY_CARE_PROVIDER_SITE_OTHER): Payer: BC Managed Care – PPO | Admitting: Gastroenterology

## 2022-02-09 VITALS — BP 128/84 | HR 75 | Ht 66.0 in | Wt 253.0 lb

## 2022-02-09 DIAGNOSIS — K760 Fatty (change of) liver, not elsewhere classified: Secondary | ICD-10-CM

## 2022-02-09 DIAGNOSIS — Z1211 Encounter for screening for malignant neoplasm of colon: Secondary | ICD-10-CM | POA: Diagnosis not present

## 2022-02-09 DIAGNOSIS — R1013 Epigastric pain: Secondary | ICD-10-CM

## 2022-02-09 DIAGNOSIS — D7589 Other specified diseases of blood and blood-forming organs: Secondary | ICD-10-CM

## 2022-02-09 LAB — FOLATE: Folate: 8 ng/mL (ref >5.9)

## 2022-02-09 LAB — VITAMIN B12: Vitamin B-12: 138 pg/mL — ABNORMAL LOW (ref 211–911)

## 2022-02-09 LAB — GAMMA GT: GGT: 23 U/L (ref 7–51)

## 2022-02-09 MED ORDER — OMEPRAZOLE 20 MG PO CPDR: 20.0000 mg | DELAYED_RELEASE_CAPSULE | Freq: Two times a day (BID) | ORAL | 3 refills | Status: DC

## 2022-02-09 MED ORDER — NA SULFATE-K SULFATE-MG SULF 17.5-3.13-1.6 GM/177ML PO SOLN: 1.0000 | Freq: Once | ORAL | 0 refills | Status: AC

## 2022-02-09 NOTE — Progress Notes (Signed)
HPI : Angela Long is a very pleasant 61 year old female with a history of obesity who was referred to [place] by Leretha Pol NP for further evaluation of upper abdominal pain.  She underwent an ultrasound April 25 which showed increased echogenicity of the liver.  No gallstones were noted.  Her liver enzymes were essentially normal other than a very slightly elevated alkaline phosphatase.  CBC was notable for macrocytosis, normal platelet count. Patient states that she has had upper abdominal discomfort and bloating for at least a month now.  The pain is worse after eating and can last several hours before resolving.  If she does not eat, the pain is minimal and sometimes not noticed.  No significant nausea or vomiting.  Her bowel movements have been normal, with 1-2 formed stools per day.  No blood in the stool.  Constipation and diarrhea are not problems for her.  Her appetite has been good and her weight has been stable. She takes Advil 400 mg every day for arthritis pain.  She has been taking over-the-counter Nexium daily about a year now for GERD symptoms. No family history of GI malignancy, peptic ulcer disease or inflammatory bowel disease. She has never had an upper or lower endoscopy. Patient reports she drinks on a daily basis, typically at least 2 or 3.  She has done this for many years.  She does not drink to excess or get drunk.   Past Medical History:  Diagnosis Date   GERD (gastroesophageal reflux disease)      Past Surgical History:  Procedure Laterality Date   CESAREAN SECTION     Family History  Problem Relation Age of Onset   Hypertension Mother    Neuropathy Mother    Heart disease Mother 104       PM   Cancer Father 33       " abdominal cancer"   Alcohol abuse Sister    Hypertension Sister 5   Diabetes Brother 37       type II   Diabetes Maternal Grandmother    Cancer Maternal Grandmother        pancreatic   Drug abuse Son    Social History   Tobacco Use    Smoking status: Former    Packs/day: 0.01    Years: 35.00    Pack years: 0.35    Types: Cigarettes   Smokeless tobacco: Never  Substance Use Topics   Alcohol use: Yes    Alcohol/week: 18.0 standard drinks    Types: 18 Cans of beer per week    Comment: drinks on weekends only   Drug use: No   Current Outpatient Medications  Medication Sig Dispense Refill   ibuprofen (ADVIL) 200 MG tablet Take 400 mg by mouth daily. Knee pain     Lysine 1000 MG TABS Take 1 tablet by mouth daily as needed (fever blisters).     No current facility-administered medications for this visit.   No Known Allergies   Review of Systems: All systems reviewed and negative except where noted in HPI.    US Abdomen Complete  Result Date: 01/10/2022 CLINICAL DATA:  Abdominal pain EXAM: ABDOMEN ULTRASOUND COMPLETE COMPARISON:  None. FINDINGS: Limited study due to body habitus. Gallbladder: No gallstones or wall thickening visualized. No sonographic Murphy sign noted by sonographer. Common bile duct: Diameter: 4 mm Liver: Coarse, increased echogenicity of the parenchyma with no focal mass identified. Portal vein is patent on color Doppler imaging with normal direction of blood  flow towards the liver. IVC: Not visualized. Pancreas: Visualized portion unremarkable. Spleen: Upper normal size with no mass identified. Right Kidney: Length: 11.7 cm. Echogenicity within normal limits. No mass or hydronephrosis visualized. Left Kidney: Length: 13.1 cm. Echogenicity within normal limits. No mass or hydronephrosis visualized. Abdominal aorta: No aneurysm visualized. Other findings: None. IMPRESSION: Coarse, increased echogenicity of the liver parenchyma suggesting hepatic steatosis and/or other hepatocellular disease. Electronically Signed   By: Ofilia Neas M.D.   On: 01/10/2022 13:22    Physical Exam: BP 128/84   Pulse 75   Ht _0  (1.676 m)   Wt 253 lb (114.8 kg)   BMI 40.84 kg/m  Constitutional:  Pleasant,well-developed, Caucasian female in no acute distress. HEENT: Normocephalic and atraumatic. Conjunctivae are normal. No scleral icterus. Neck supple.  Cardiovascular: Normal rate, regular rhythm.  Pulmonary/chest: Effort normal and breath sounds normal. No wheezing, rales or rhonchi. Abdominal: Soft, nondistended, nontender. Bowel sounds active throughout. There are no masses palpable. No hepatomegaly. Extremities: no edema Neurological: Alert and oriented to person place and time. Skin: Skin is warm and dry. No rashes noted. Psychiatric: Normal mood and affect. Behavior is normal.  CBC    Component Value Date/Time   WBC 7.5 01/03/2022 1622   WBC 6.6 02/17/2021 1343   RBC 4.10 01/03/2022 1622   RBC 3.75 (L) 02/17/2021 1343   HGB 15.2 01/03/2022 1622   HCT 42.1 01/03/2022 1622   PLT 222 01/03/2022 1622   MCV 103 (H) 01/03/2022 1622   MCH 37.1 (H) 01/03/2022 1622   MCH 35.5 (H) 02/17/2021 1343   MCHC 36.1 (H) 01/03/2022 1622   MCHC 34.2 02/17/2021 1343   RDW 12.6 01/03/2022 1622   LYMPHSABS 2.0 01/03/2022 1622   MONOABS 561 05/19/2016 0900   EOSABS 0.1 01/03/2022 1622   BASOSABS 0.1 01/03/2022 1622    CMP     Component Value Date/Time   NA 143 01/03/2022 1622   K CANCELED 01/03/2022 1622   CL 101 01/03/2022 1622   CO2 21 01/03/2022 1622   GLUCOSE CANCELED 01/03/2022 1622   GLUCOSE 89 02/17/2021 1343   BUN 13 01/03/2022 1622   CREATININE 0.69 01/03/2022 1622   CREATININE 0.60 05/19/2016 0900   CALCIUM 9.7 01/03/2022 1622   PROT 7.7 01/03/2022 1622   ALBUMIN 4.6 01/03/2022 1622   AST 26 01/03/2022 1622   ALT 18 01/03/2022 1622   ALKPHOS 123 (H) 01/03/2022 1622   BILITOT 0.7 01/03/2022 1622   GFRNONAA >60 02/17/2021 1343   GFRNONAA >89 05/19/2016 0900   GFRAA >89 05/19/2016 0900   Component Ref Range & Units 11 mo ago (03/10/21) 11 mo ago (02/17/21) 5 yr ago (05/19/16)  Glucose 65 - 99 mg/dL 84  89 R, CM  79   BUN 6 - 24 mg/dL 10  11 R  10 R   Creatinine,  Ser 0.57 - 1.00 mg/dL 0.57  0.69 R  0.60 R, CM   eGFR >59 mL/min/1.73 105     BUN/Creatinine Ratio 9 - 23 18     Sodium 134 - 144 mmol/L 141  139 R  142 R   Potassium 3.5 - 5.2 mmol/L 4.1  3.4 Low  R  5.4 High  R, CM   Chloride 96 - 106 mmol/L 101  103 R  105 R   CO2 20 - 29 mmol/L 23  27 R  27 R   Calcium 8.7 - 10.2 mg/dL 9.6  9.0 R  9.3 R   Total Protein  6.0 - 8.5 g/dL 7.3   7.1 R   Albumin 3.8 - 4.9 g/dL 4.3   4.1 R   Globulin, Total 1.5 - 4.5 g/dL 3.0     Albumin/Globulin Ratio 1.2 - 2.2 1.4     Bilirubin Total 0.0 - 1.2 mg/dL 0.8   0.5 R   Alkaline Phosphatase 44 - 121 IU/L 114   81 R   AST 0 - 40 IU/L 25   17 R   ALT 0 - 32 IU/L 20   14 R      1 mo ago  Lipase 14 - 72 U/L 19     ASSESSMENT AND PLAN: 61 year old female with several weeks of epigastric pain and bloating, in the setting of frequent NSAID use and chronic moderate to heavy alcohol use.  Ultrasound notable for fatty change of liver without gallstones.  Liver enzymes normal except for slightly elevated alkaline phosphatase.  CBC with macrocytosis without anemia. With regards to her abdominal pain, given her frequent NSAID use, peptic ulcer disease is high on the differential.  I recommend we perform an upper endoscopy.  I suggest she stop taking Advil, and take Tylenol instead for her arthritis pain.  We will start omeprazole 20 mg twice daily. 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  We discussed the significance of fatty liver and the potential for progression to cirrhosis.  I recommended that she stop drinking completely or at least cut back significantly to reduce her chances of having significant liver disease.  I pointed to her macrocytosis as evidence that alcohol is also doing damage to her bone marrow.  The patient was in agreement with the need to stop drinking, and plan to do so. Chronic pancreatitis also  possible cause of her abdominal pain given her alcohol and tobacco history.  Will obtain fecal elastase. Patient is also due for average risk colon cancer screening.  She would like to go ahead and schedule this, but would prefer to do it separately from her upper endoscopy in order to get her upper endoscopy done in expedited fashion.  Abdominal pain -EGD -Stop Advil, use Tylenol for arthritis pain - Omeprazole 20 mg p.o. twice daily - Fecal elastase  Fatty liver -Alcohol cessation - Repeat ultrasound in 1 year, consider elastography - GGT  Colon cancer screening - Colonoscopy  Macrocytosis - B12, folate  The details, risks (including bleeding, perforation, infection, missed lesions, medication reactions and possible hospitalization or surgery if complications occur), benefits, and alternatives to EGD/colonoscopy with possible biopsy, dilation and polypectomy were discussed with the patient and she consents to proceed.   Cattaleya Wien E. Candis Schatz, MD [facility]   Ronnell Freshwater, NP

## 2022-02-09 NOTE — Patient Instructions (Signed)
If you are age 60 or older, your body mass index should be between 23-30. Your Body mass index is 40.84 kg/m. If this is out of the aforementioned range listed, please consider follow up with your Primary Care Provider.  If you are age 30 or younger, your body mass index should be between 19-25. Your Body mass index is 40.84 kg/m. If this is out of the aformentioned range listed, please consider follow up with your Primary Care Provider.   You have been scheduled for a colonoscopy. Please follow written instructions given to you at your visit today.  Please pick up your prep supplies at the pharmacy within the next 1-3 days. If you use inhalers (even only as needed), please bring them with you on the day of your procedure.  You have been scheduled for an endoscopy. Please follow written instructions given to you at your visit today. If you use inhalers (even only as needed), please bring them with you on the day of your procedure.  Your provider has requested that you go to the basement level for lab work before leaving today. Press "B" on the elevator. The lab is located at the first door on the left as you exit the elevator.  We have sent the following medications to your pharmacy for you to pick up at your convenience: Prilosec 20 mg twice daily.   The [facility] providers would like to encourage you to use [facility] to communicate with providers for non-urgent requests or questions.  Due to long hold times on the telephone, sending your provider a message by [facility] may be a faster and more efficient way to get a response.  Please allow 48 business hours for a response.  Please remember that this is for non-urgent requests.   It was a pleasure to see you today!  Thank you for trusting me with your gastrointestinal care!     Scott E.Tomasa Rand, MD

## 2022-02-10 ENCOUNTER — Encounter: Payer: BC Managed Care – PPO | Admitting: Gastroenterology

## 2022-02-11 NOTE — Progress Notes (Signed)
Angela Long,  Your vitamin B12 level was low.  This may explain your macrocytosis (large red blood cells).  Your GGT was normal.  The GGT is an enzyme found in the liver which is typically elevated with excessive alcohol use.  This is good news and may indicate that your fatty liver changes are less related to chronic alcohol use.  I would still recommend cutting back on your alcohol consumption because of your fatty liver, as alcohol will contribute to further liver damage. To treat your B12 deficiency, I recommend you get monthly B12 injections.   We will evaluate for potential causes of B12 deficiency when we perform your upper and lower endoscopies next month.  Angela Long,  Can you please place a prescription for cyanocobalamin IM injections #[id number] and have her come to the clinic for her injection?

## 2022-02-14 ENCOUNTER — Other Ambulatory Visit: Payer: Self-pay

## 2022-02-14 MED ORDER — CYANOCOBALAMIN 1000 MCG/ML IJ SOLN: INTRAMUSCULAR | 5 refills | Status: DC

## 2022-02-23 ENCOUNTER — Telehealth: Payer: Self-pay | Admitting: Gastroenterology

## 2022-02-23 NOTE — Telephone Encounter (Signed)
Good Afternoon Dr. Tomasa Rand,  Patient called stating she needed to reschedule her EGD with you tomorrow at 11:30 due to having a cold.  Patient was rescheduled for 7/5 at 10:00.

## 2022-02-24 ENCOUNTER — Encounter: Payer: BC Managed Care – PPO | Admitting: Gastroenterology

## 2022-02-24 DIAGNOSIS — R051 Acute cough: Secondary | ICD-10-CM | POA: Diagnosis not present

## 2022-03-09 ENCOUNTER — Encounter: Payer: BC Managed Care – PPO | Admitting: Gastroenterology

## 2022-03-22 ENCOUNTER — Encounter: Payer: BC Managed Care – PPO | Admitting: Gastroenterology

## 2022-03-28 ENCOUNTER — Encounter: Payer: Self-pay | Admitting: Gastroenterology

## 2022-03-28 ENCOUNTER — Ambulatory Visit (AMBULATORY_SURGERY_CENTER): Payer: BC Managed Care – PPO | Admitting: Gastroenterology

## 2022-03-28 VITALS — BP 123/66 | HR 77 | Temp 98.6°F | Resp 14 | Ht 66.0 in | Wt 253.0 lb

## 2022-03-28 DIAGNOSIS — K317 Polyp of stomach and duodenum: Secondary | ICD-10-CM

## 2022-03-28 DIAGNOSIS — K219 Gastro-esophageal reflux disease without esophagitis: Secondary | ICD-10-CM | POA: Diagnosis not present

## 2022-03-28 DIAGNOSIS — K2950 Unspecified chronic gastritis without bleeding: Secondary | ICD-10-CM | POA: Diagnosis not present

## 2022-03-28 DIAGNOSIS — K2970 Gastritis, unspecified, without bleeding: Secondary | ICD-10-CM

## 2022-03-28 DIAGNOSIS — B9681 Helicobacter pylori [H. pylori] as the cause of diseases classified elsewhere: Secondary | ICD-10-CM | POA: Diagnosis not present

## 2022-03-28 DIAGNOSIS — R1013 Epigastric pain: Secondary | ICD-10-CM

## 2022-03-28 MED ORDER — SODIUM CHLORIDE 0.9 % IV SOLN: 500.0000 mL | INTRAVENOUS | Status: DC

## 2022-03-28 NOTE — Op Note (Signed)
[facility] Patient Name: Angela Long Procedure Date: 03/28/2022 7:09 AM MRN: [id number] Endoscopist: Lorin Picket E. Tomasa Rand , MD Age: 61 Referring MD:  Date of Birth: Sep 15, 1961 Gender: Female Account #: [id number] Procedure:                Upper GI endoscopy Indications:              Epigastric abdominal pain, B12 deficiency Medicines:                Monitored Anesthesia Care Procedure:                Pre-Anesthesia Assessment:                           - Prior to the procedure, a History and Physical                            was performed, and patient medications and                            allergies were reviewed. The patient's tolerance of                            previous anesthesia was also reviewed. The risks                            and benefits of the procedure and the sedation                            options and risks were discussed with the patient.                            All questions were answered, and informed consent                            was obtained. Prior Anticoagulants: The patient has                            taken no previous anticoagulant or antiplatelet                            agents. ASA Grade Assessment: III - A patient with                            severe systemic disease. After reviewing the risks                            and benefits, the patient was deemed in                            satisfactory condition to undergo the procedure.                           After obtaining informed consent, the endoscope was  passed under direct vision. Throughout the                            procedure, the patient's blood pressure, pulse, and                            oxygen saturations were monitored continuously. The                            GIF [id number] #[id number] was introduced through the                            mouth, and advanced to the third part of duodenum.                            The upper  GI endoscopy was accomplished without                            difficulty. The patient tolerated the procedure                            well. Scope In: Scope Out: Findings:                 The examined portions of the nasopharynx,                            oropharynx and larynx were normal.                           The examined esophagus was normal.                           A small hiatal hernia was present.                           Striped mildly erythematous mucosa without bleeding                            was found in the gastric antrum. Biopsies were                            taken with a cold forceps for Helicobacter pylori                            testing. Estimated blood loss was minimal.                           The exam of the stomach was otherwise normal.                           Biopsies were taken with a cold forceps in the                            gastric body for histology to assess for autoimmune  gastritis. Estimated blood loss was minimal.                           A single 2 mm sessile polyp was found in the                            gastric body. The polyp was removed with a cold                            biopsy forceps. Resection and retrieval were                            complete. Estimated blood loss was minimal.                           The examined duodenum was normal. Complications:            No immediate complications. Estimated Blood Loss:     Estimated blood loss was minimal. Impression:               - The examined portions of the nasopharynx,                            oropharynx and larynx were normal.                           - Normal esophagus.                           - Small hiatal hernia.                           - Erythematous mucosa in the antrum. Biopsied.                           - A single gastric polyp. Resected and retrieved.                           - Normal examined duodenum.                            - Biopsies were taken with a cold forceps for                            histology in the gastric body.                           - No obvious endoscopic abnormalities to explain                            patient's epigastric pain Recommendation:           - Patient has a contact number available for                            emergencies. The signs and symptoms of potential  delayed complications were discussed with the                            patient. Return to normal activities tomorrow.                            Written discharge instructions were provided to the                            patient.                           - Resume previous diet.                           - Continue present medications.                           - Await pathology results.                           - Follow up in clinic as needed for ongoing                            abdominal pain. Dameion Briles E. Tomasa Rand, MD 03/28/2022 8:19:58 AM This report has been signed electronically.

## 2022-03-28 NOTE — Progress Notes (Signed)
[facility] History and Physical   Primary Care Physician:  Carlean Jews, NP   Reason for Procedure:   Epigastric pain  Plan:    EGD     HPI: Angela Long is a 61 y.o. female undergoing EGD to evaluate chronic epigastric pain worse with eating, in the setting of chronic NSAID use, despite PPI therapy.  She was prescribed omeprazole but her insurance did not cover it so she has not been taking it.  She takes OTC Nexium currently.   Past Medical History:  Diagnosis Date   GERD (gastroesophageal reflux disease)     Past Surgical History:  Procedure Laterality Date   CESAREAN SECTION     x 2   OTHER SURGICAL HISTORY     polyps removed from vocal cords    Prior to Admission medications   Medication Sig Start Date End Date Taking? Authorizing Provider  cyanocobalamin (,VITAMIN B-12,) 1000 MCG/ML injection Give IM monthly 02/14/22   Jenel Lucks, MD  esomeprazole (NEXIUM) 20 MG capsule Take 20 mg by mouth daily at 12 noon.   Yes [provider]  ibuprofen (ADVIL) 200 MG tablet Take 400 mg by mouth daily. Knee pain   Yes [provider]  Lysine 1000 MG TABS Take 1 tablet by mouth daily as needed (fever blisters).    [provider]  Na Sulfate-K Sulfate-Mg Sulf 17.5-3.13-1.6 GM/177ML SOLN Take by mouth as directed. 02/09/22   [provider]  omeprazole (PRILOSEC) 20 MG capsule Take 1 capsule (20 mg total) by mouth 2 (two) times daily. Patient not taking: Reported on 03/28/2022 02/09/22   Jenel Lucks, MD    Current Outpatient Medications  Medication Sig Dispense Refill   cyanocobalamin (,VITAMIN B-12,) 1000 MCG/ML injection Give IM monthly 1 mL 5   esomeprazole (NEXIUM) 20 MG capsule Take 20 mg by mouth daily at 12 noon.     ibuprofen (ADVIL) 200 MG tablet Take 400 mg by mouth daily. Knee pain     Lysine 1000 MG TABS Take 1 tablet by mouth daily as needed (fever blisters).     Na Sulfate-K  Sulfate-Mg Sulf 17.5-3.13-1.6 GM/177ML SOLN Take by mouth as directed.     omeprazole (PRILOSEC) 20 MG capsule Take 1 capsule (20 mg total) by mouth 2 (two) times daily. (Patient not taking: Reported on 03/28/2022) 90 capsule 3   Current Facility-Administered Medications  Medication Dose Route Frequency Provider Last Rate Last Admin   0.9 %  sodium chloride infusion  500 mL Intravenous Continuous Jenel Lucks, MD        Allergies as of 03/28/2022   (No Known Allergies)    Family History  Problem Relation Age of Onset   Hypertension Mother    Neuropathy Mother    Heart disease Mother 49       PM   Cancer Father 52       " abdominal cancer"   Alcohol abuse Sister    Hypertension Sister 94   Diabetes Brother 26       type II   Diabetes Maternal Grandmother    Cancer Maternal Grandmother        pancreatic   Drug abuse Son    Colon cancer Neg Hx    Esophageal cancer Neg Hx    Stomach cancer Neg Hx     Social History   Socioeconomic History   Marital status: Married    Spouse name: Not on file   Number  of children: Not on file   Years of education: Not on file   Highest education level: Not on file  Occupational History   Not on file  Tobacco Use   Smoking status: Former    Packs/day: 0.01    Years: 35.00    Total pack years: 0.35    Types: Cigarettes   Smokeless tobacco: Never  Vaping Use   Vaping Use: Never used  Substance and Sexual Activity   Alcohol use: Yes    Alcohol/week: 18.0 standard drinks of alcohol    Types: 18 Cans of beer per week    Comment: drinks on weekends only   Drug use: No   Sexual activity: Yes  Other Topics Concern   Not on file  Social History Narrative   Not on file   Social Determinants of Health   Financial Resource Strain: Not on file  Food Insecurity: Not on file  Transportation Needs: Not on file  Physical Activity: Not on file  Stress: Not on file  Social Connections: Not on file  Intimate Partner Violence: Not  on file    Review of Systems:  All other review of systems negative except as mentioned in the HPI.  Physical Exam: Vital signs BP (!) 165/97   Pulse 84   Temp 98.6 F (37 C)   Ht 5\' 6"  (1.676 m)   Wt 253 lb (114.8 kg)   SpO2 97%   BMI 40.84 kg/m   General:   Alert,  Well-developed, well-nourished, pleasant and cooperative in NAD Airway:  Mallampati 2 Lungs:  Clear throughout to auscultation.   Heart:  Regular rate and rhythm; no murmurs, clicks, rubs,  or gallops. Abdomen:  Soft, nontender and nondistended. Normal bowel sounds.   Neuro/Psych:  Normal mood and affect. A and O x 3   Omare Bilotta E. , MD [facility]

## 2022-03-28 NOTE — Progress Notes (Signed)
Called to room to assist during endoscopic procedure.  Patient ID and intended procedure confirmed with present staff. Received instructions for my participation in the procedure from the performing physician.  

## 2022-03-28 NOTE — Progress Notes (Signed)
Sedate, gd SR, tolerated procedure well, VSS, report to RN 

## 2022-03-28 NOTE — Patient Instructions (Signed)
Await pathology results.   Continue current medications.   YOU HAD AN ENDOSCOPIC PROCEDURE TODAY AT THE [facility]:   Refer to the procedure report that was given to you for any specific questions about what was found during the examination.  If the procedure report does not answer your questions, please call your gastroenterologist to clarify.  If you requested that your care partner not be given the details of your procedure findings, then the procedure report has been included in a sealed envelope for you to review at your convenience later.  YOU SHOULD EXPECT: Some feelings of bloating in the abdomen. Passage of more gas than usual.  Walking can help get rid of the air that was put into your GI tract during the procedure and reduce the bloating. If you had a lower endoscopy (such as a colonoscopy or flexible sigmoidoscopy) you may notice spotting of blood in your stool or on the toilet paper. If you underwent a bowel prep for your procedure, you may not have a normal bowel movement for a few days.  Please Note:  You might notice some irritation and congestion in your nose or some drainage.  This is from the oxygen used during your procedure.  There is no need for concern and it should clear up in a day or so.  SYMPTOMS TO REPORT IMMEDIATELY:  Following upper endoscopy (EGD)  Vomiting of blood or coffee ground material  New chest pain or pain under the shoulder blades  Painful or persistently difficult swallowing  New shortness of breath  Fever of 100F or higher  Black, tarry-looking stools  For urgent or emergent issues, a gastroenterologist can be reached at any hour by calling [phone]. Do not use MyChart messaging for urgent concerns.    DIET:  We do recommend a small meal at first, but then you may proceed to your regular diet.  Drink plenty of fluids but you should avoid alcoholic beverages for 24 hours.  ACTIVITY:  You should plan to take it easy for the rest  of today and you should NOT DRIVE or use heavy machinery until tomorrow (because of the sedation medicines used during the test).    FOLLOW UP: Our staff will call the number listed on your records the next business day following your procedure.  We will call around 7:15- 8:00 am to check on you and address any questions or concerns that you may have regarding the information given to you following your procedure. If we do not reach you, we will leave a message.  If you develop any symptoms (ie: fever, flu-like symptoms, shortness of breath, cough etc.) before then, please call [phone].  If you test positive for Covid 19 in the 2 weeks post procedure, please call and report this information to us.    If any biopsies were taken you will be contacted by phone or by letter within the next 1-3 weeks.  Please call us at [phone] if you have not heard about the biopsies in 3 weeks.    SIGNATURES/CONFIDENTIALITY: You and/or your care partner have signed paperwork which will be entered into your electronic medical record.  These signatures attest to the fact that that the information above on your After Visit Summary has been reviewed and is understood.  Full responsibility of the confidentiality of this discharge information lies with you and/or your care-partner.  

## 2022-03-29 ENCOUNTER — Telehealth: Payer: Self-pay | Admitting: *Deleted

## 2022-03-29 NOTE — Telephone Encounter (Signed)
  Follow up Call-     03/28/2022    7:06 AM  Call back number  Post procedure Call Back phone  # [phone]  Permission to leave phone message Yes     Patient questions:  Do you have a fever, pain , or abdominal swelling? No. Pain Score  0 *  Have you tolerated food without any problems? Yes.    Have you been able to return to your normal activities? Yes.    Do you have any questions about your discharge instructions: Diet   No. Medications  No. Follow up visit  No.  Do you have questions or concerns about your Care? No.  Actions: * If pain score is 4 or above: No action needed, pain <4.

## 2022-03-31 NOTE — Progress Notes (Signed)
Ms. Jaquez,  The biopsies from the recent upper GI Endoscopy were notable for H. Pylori gastritis.  This can be a a cause of abdominal pain.  We will plan on treating with quadruple therapy as below. Please confirm no medication allergies to the prescribed regimen.   1) Omeprazole 20 mg 2 times a day x 14 d 2) Pepto Bismol 2 tabs (262 mg each) 4 times a day x 14 d 3) Metronidazole 250 mg 4 times a day x 14 d 4) doxycycline 100 mg 2 times a day x 14 d  After 14 days, ok to stop omeprazole.  4 weeks after treatment completed, check H. Pylori stool antigen to confirm eradication (must be off acid suppression therapy for 2 weeks prior to specimen submission)  Dx: Rexene Edison Pylori gastritis   Bonita Quin,  Can you place the orders for these prescriptions and stool antigen test? She should already have omeprazole.

## 2022-04-03 ENCOUNTER — Other Ambulatory Visit: Payer: Self-pay

## 2022-04-03 DIAGNOSIS — R1013 Epigastric pain: Secondary | ICD-10-CM

## 2022-04-03 MED ORDER — METRONIDAZOLE 250 MG PO TABS: 250.0000 mg | ORAL_TABLET | Freq: Four times a day (QID) | ORAL | 0 refills | Status: DC

## 2022-04-03 MED ORDER — BISMUTH 262 MG PO CHEW: 524.0000 mg | CHEWABLE_TABLET | Freq: Four times a day (QID) | ORAL | 0 refills | Status: AC

## 2022-04-03 MED ORDER — DOXYCYCLINE HYCLATE 100 MG PO CAPS: 100.0000 mg | ORAL_CAPSULE | Freq: Two times a day (BID) | ORAL | 0 refills | Status: DC

## 2022-04-03 MED ORDER — OMEPRAZOLE 20 MG PO CPDR: 20.0000 mg | DELAYED_RELEASE_CAPSULE | Freq: Two times a day (BID) | ORAL | 0 refills | Status: DC

## 2022-04-11 ENCOUNTER — Telehealth: Payer: Self-pay

## 2022-04-11 NOTE — Telephone Encounter (Signed)
Patient Advocate Encounter   Received notification from [facility] that insurance does not cover Omeprazole 20MG  twice daily for this patient. Pharmacy is requesting new script for 40 MG daily if applicable.  , [facility] Pharmacy Patient Advocate Specialist [facility] Patient Advocate Team Phone: [phone]   Fax: [phone]

## 2022-04-14 ENCOUNTER — Other Ambulatory Visit: Payer: Self-pay

## 2022-04-14 MED ORDER — OMEPRAZOLE 40 MG PO CPDR: 40.0000 mg | DELAYED_RELEASE_CAPSULE | Freq: Every day | ORAL | 3 refills | Status: DC

## 2022-04-14 NOTE — Telephone Encounter (Signed)
Prescription for Omeprazole 40 mg daily has been sent to patients pharmacy.

## 2022-09-17 ENCOUNTER — Other Ambulatory Visit: Payer: Self-pay | Admitting: Gastroenterology

## 2022-09-20 ENCOUNTER — Other Ambulatory Visit: Payer: Self-pay

## 2022-09-20 DIAGNOSIS — E538 Deficiency of other specified B group vitamins: Secondary | ICD-10-CM

## 2022-09-20 NOTE — Telephone Encounter (Signed)
Left VM for patient to return call. Patient needs to have labs done before refilling B12.

## 2022-10-10 DIAGNOSIS — Z6841 Body Mass Index (BMI) 40.0 and over, adult: Secondary | ICD-10-CM | POA: Diagnosis not present

## 2022-10-10 DIAGNOSIS — Z808 Family history of malignant neoplasm of other organs or systems: Secondary | ICD-10-CM | POA: Diagnosis not present

## 2022-10-10 DIAGNOSIS — Z124 Encounter for screening for malignant neoplasm of cervix: Secondary | ICD-10-CM | POA: Diagnosis not present

## 2022-10-10 DIAGNOSIS — Z1231 Encounter for screening mammogram for malignant neoplasm of breast: Secondary | ICD-10-CM | POA: Diagnosis not present

## 2022-10-10 DIAGNOSIS — Z01419 Encounter for gynecological examination (general) (routine) without abnormal findings: Secondary | ICD-10-CM | POA: Diagnosis not present

## 2022-10-10 DIAGNOSIS — E538 Deficiency of other specified B group vitamins: Secondary | ICD-10-CM | POA: Diagnosis not present

## 2022-11-06 DIAGNOSIS — R051 Acute cough: Secondary | ICD-10-CM | POA: Diagnosis not present

## 2022-11-06 DIAGNOSIS — Z6841 Body Mass Index (BMI) 40.0 and over, adult: Secondary | ICD-10-CM | POA: Diagnosis not present

## 2022-11-06 DIAGNOSIS — J00 Acute nasopharyngitis [common cold]: Secondary | ICD-10-CM | POA: Diagnosis not present

## 2022-11-06 DIAGNOSIS — R030 Elevated blood-pressure reading, without diagnosis of hypertension: Secondary | ICD-10-CM | POA: Diagnosis not present

## 2022-11-07 ENCOUNTER — Telehealth: Payer: BC Managed Care – PPO | Admitting: Family Medicine

## 2022-11-07 DIAGNOSIS — J069 Acute upper respiratory infection, unspecified: Secondary | ICD-10-CM | POA: Diagnosis not present

## 2022-11-07 MED ORDER — PROMETHAZINE-DM 6.25-15 MG/5ML PO SYRP: 5.0000 mL | ORAL_SOLUTION | Freq: Three times a day (TID) | ORAL | 0 refills | Status: AC | PRN

## 2022-11-07 NOTE — Progress Notes (Signed)
Virtual Visit Consent   Angela Long, you are scheduled for a virtual visit with a [facility] provider today. Just as with appointments in the office, your consent must be obtained to participate. Your consent will be active for this visit and any virtual visit you may have with one of our providers in the next 365 days. If you have a MyChart account, a copy of this consent can be sent to you electronically.  As this is a virtual visit, video technology does not allow for your provider to perform a traditional examination. This may limit your provider's ability to fully assess your condition. If your provider identifies any concerns that need to be evaluated in person or the need to arrange testing (such as labs, EKG, etc.), we will make arrangements to do so. Although advances in technology are sophisticated, we cannot ensure that it will always work on either your end or our end. If the connection with a video visit is poor, the visit may have to be switched to a telephone visit. With either a video or telephone visit, we are not always able to ensure that we have a secure connection.  By engaging in this virtual visit, you consent to the provision of healthcare and authorize for your insurance to be billed (if applicable) for the services provided during this visit. Depending on your insurance coverage, you may receive a charge related to this service.  I need to obtain your verbal consent now. Are you willing to proceed with your visit today? Angela Long has provided verbal consent on 11/07/2022 for a virtual visit (video or telephone). Perlie Mayo, NP  Date: 11/07/2022 10:38 AM  Virtual Visit via Video Note   I, Perlie Mayo, connected with  Angela Long  ([id number], 07-04-1961) on 11/07/22 at 10:45 AM EST by a video-enabled telemedicine application and verified that I am speaking with the correct person using two identifiers.  Location: Patient: Virtual Visit Location Patient:  Home Provider: Virtual Visit Location Provider: Home Office   I discussed the limitations of evaluation and management by telemedicine and the availability of in person appointments. The patient expressed understanding and agreed to proceed.    History of Present Illness: Angela Long is a 62 y.o. who identifies as a female who was assigned female at birth, and is being seen today for cough. [facility] yesterday- neg covid.  Onset was 3 days ago- Saturday 11/04/22 night. Associated symptoms are congestion, sore throat, mucus is clear but thick. Modifying factors are include- nasal [facility] and mucinex plain. 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   Hypokalemia 03/20/2021   Other fatigue 03/20/2021   Abnormal weight gain 03/20/2021   Body mass index (BMI) of 45.0-49.9 in adult [facility]) 03/20/2021   Encounter for screening mammogram for malignant neoplasm of breast 03/20/2021   Healthcare maintenance 03/20/2021   Vitamin D insufficiency 06/17/2016   BPPV (benign paroxysmal positional vertigo) 05/31/2016   BMI 36.0-36.9,adult 05/19/2016   h/o Common wart- legs 05/19/2016   Tobacco use disorder 05/19/2016   Tobacco abuse counseling 05/19/2016    Allergies: No Known Allergies Medications:  Current Outpatient  Medications:    cyanocobalamin (,VITAMIN B-12,) 1000 MCG/ML injection, Give 1079mg IM monthly, Disp: 1 mL, Rfl: 5   Bismuth 262 MG CHEW, Chew 524 mg by mouth in the morning, at noon, in the evening, and at bedtime., Disp: 112 tablet, Rfl: 0   doxycycline (VIBRAMYCIN) 100 MG capsule, Take 1 capsule (100 mg total) by mouth 2 (two) times daily.,  Disp: 28 capsule, Rfl: 0   esomeprazole (NEXIUM) 20 MG capsule, Take 20 mg by mouth daily at 12 noon., Disp: , Rfl:    ibuprofen (ADVIL) 200 MG tablet, Take 400 mg by mouth daily. Knee pain, Disp: , Rfl:    Lysine 1000 MG TABS, Take 1 tablet by mouth daily as needed (fever blisters)., Disp: , Rfl:    metroNIDAZOLE (FLAGYL) 250 MG tablet, Take 1 tablet (250 mg total) by mouth 4 (four) times daily., Disp: 56 tablet, Rfl: 0   Na Sulfate-K Sulfate-Mg Sulf 17.5-3.13-1.6 GM/177ML SOLN, Take by mouth as directed., Disp: , Rfl:    omeprazole (PRILOSEC) 40 MG capsule, Take 1 capsule (40 mg total) by mouth daily., Disp: 90 capsule, Rfl: 3  Observations/Objective: Patient is well-developed, well-nourished in no acute distress.  Resting comfortably  at home.  Head is normocephalic, atraumatic.  No labored breathing.  Speech is clear and coherent with logical content.  Patient is alert and oriented at baseline.   Cough present  Assessment and Plan:   1. Viral URI with cough  - promethazine-dextromethorphan (PROMETHAZINE-DM) 6.25-15 MG/5ML syrup; Take 5 mLs by mouth 3 (three) times daily as needed for cough.  Dispense: 118 mL; Refill: 0  -Take meds as prescribed- continue tessalon, mucinex, and nasal spray -Rest -Use a cool mist humidifier especially during the winter months when heat dries out the air. - Use saline nose sprays frequently to help soothe nasal passages and promote drainage. -Saline irrigations of the nose can be very helpful if done frequently.             * 4X daily for 1 week*             * Use of a nettie pot can be helpful with this.  *Follow directions with this* *Boiled or distilled water only -stay hydrated by drinking plenty of fluids - Keep thermostat turn down low to prevent drying out sinuses - For any cough or congestion- robitussin DM or Delsym as needed - For fever or aches or pains- take tylenol or ibuprofen as directed on bottle             * for fevers greater  than 101 orally you may alternate ibuprofen and tylenol every 3 hours.  If you do not improve you will need a follow up visit in person.                 Reviewed side effects, risks and benefits of medication.    Patient acknowledged agreement and understanding of the plan.   Past Medical, Surgical, Social History, Allergies, and Medications have been Reviewed.   Follow Up Instructions: I discussed the assessment and treatment plan with the patient. The patient was provided an opportunity to ask questions and all were answered. The patient agreed with the plan and demonstrated an understanding of the instructions.  A copy of instructions were sent to the patient via MyChart unless otherwise noted below.    The patient was advised to call back or seek an in-person evaluation if the symptoms worsen or if the condition fails to  improve as anticipated.  Time:  I spent 10 minutes with the patient via telehealth technology discussing the above problems/concerns.    Perlie Mayo, NP

## 2022-11-07 NOTE — Patient Instructions (Addendum)
Angela Long, Angela Long Perlie Mayo, NP for today's virtual visit.  While this provider is not your primary care provider (PCP), if your PCP is located in our provider database this encounter information will be shared with them immediately following your visit.   [facility] account gives you access to today's visit and all your visits, tests, and labs performed at [facility] " click here if you don't have a [facility] account or go to [email or web address]  Consent: (Angela) AWA Long provided verbal consent for this virtual visit at the beginning of the encounter.  Current Medications:  Current Outpatient Medications:    promethazine-dextromethorphan (PROMETHAZINE-DM) 6.25-15 MG/5ML syrup, Take 5 mLs by mouth 3 (three) times daily as needed for cough., Disp: 118 mL, Rfl: 0   Bismuth 262 MG CHEW, Chew 524 mg by mouth in the morning, at noon, in the evening, and at bedtime., Disp: 112 tablet, Rfl: 0   esomeprazole (NEXIUM) 20 MG capsule, Take 20 mg by mouth daily at 12 noon., Disp: , Rfl:    ibuprofen (ADVIL) 200 MG tablet, Take 400 mg by mouth daily. Knee pain, Disp: , Rfl:    Lysine 1000 MG TABS, Take 1 tablet by mouth daily as needed (fever blisters)., Disp: , Rfl:    Na Sulfate-K Sulfate-Mg Sulf 17.5-3.13-1.6 GM/177ML SOLN, Take by mouth as directed., Disp: , Rfl:    omeprazole (PRILOSEC) 40 MG capsule, Take 1 capsule (40 mg total) by mouth daily., Disp: 90 capsule, Rfl: 3   Medications ordered in this encounter:  Meds ordered this encounter  Medications   promethazine-dextromethorphan (PROMETHAZINE-DM) 6.25-15 MG/5ML syrup    Sig: Take 5 mLs by mouth 3 (three) times daily as needed for cough.    Dispense:  118 mL    Refill:  0    Order Specific Question:   Supervising Provider    Answer:   Chase Picket [id number]     *If you need refills on other medications prior to your next appointment, please contact your  pharmacy*  Follow-Up: Call back or seek an in-person evaluation if the symptoms worsen or if the condition fails to improve as anticipated.  [facility] [phone]  Other Instructions  -Take meds as prescribed- continue tessalon, mucinex, and nasal spray -Rest -Use a cool mist humidifier especially during the winter months when heat dries out the air. - Use saline nose sprays frequently to help soothe nasal passages and promote drainage. -Saline irrigations of the nose can be very helpful if done frequently.             * 4X daily for 1 week*             * Use of a nettie pot can be helpful with this.  *Follow directions with this* *Boiled or distilled water only -stay hydrated by drinking plenty of fluids - Keep thermostat turn down low to prevent drying out sinuses - For any cough or congestion- robitussin DM or Delsym as needed - For fever or aches or pains- take tylenol or ibuprofen as directed on bottle             * for fevers greater than 101 orally you may alternate ibuprofen and tylenol every 3 hours.  If you do not improve you will need a follow up visit in person.   If you have been instructed to have an in-person evaluation today at a local Urgent Care facility,  please use the link below. It will take you to a list of all of our available [facility], including address, phone number and hours of operation. Please do not delay care.  [facility]  If you or a family member do not have a primary care provider, use the link below to schedule a visit and establish care. When you choose a [facility] primary care physician or advanced practice provider, you gain a long-term partner in health. Find a Primary Care Provider  Learn more about 's in-office and virtual care options: [facility]

## 2022-11-08 DIAGNOSIS — R059 Cough, unspecified: Secondary | ICD-10-CM | POA: Diagnosis not present

## 2022-11-08 DIAGNOSIS — R0602 Shortness of breath: Secondary | ICD-10-CM | POA: Diagnosis not present

## 2022-11-08 DIAGNOSIS — R051 Acute cough: Secondary | ICD-10-CM | POA: Diagnosis not present

## 2022-11-08 DIAGNOSIS — Z20828 Contact with and (suspected) exposure to other viral communicable diseases: Secondary | ICD-10-CM | POA: Diagnosis not present

## 2022-11-08 DIAGNOSIS — R062 Wheezing: Secondary | ICD-10-CM | POA: Diagnosis not present

## 2022-11-16 ENCOUNTER — Other Ambulatory Visit: Payer: Self-pay | Admitting: Gastroenterology

## 2023-02-22 DIAGNOSIS — R52 Pain, unspecified: Secondary | ICD-10-CM | POA: Diagnosis not present

## 2023-02-22 DIAGNOSIS — J208 Acute bronchitis due to other specified organisms: Secondary | ICD-10-CM | POA: Diagnosis not present

## 2023-02-22 DIAGNOSIS — B9689 Other specified bacterial agents as the cause of diseases classified elsewhere: Secondary | ICD-10-CM | POA: Diagnosis not present

## 2023-02-22 DIAGNOSIS — J189 Pneumonia, unspecified organism: Secondary | ICD-10-CM | POA: Diagnosis not present

## 2023-02-22 DIAGNOSIS — R509 Fever, unspecified: Secondary | ICD-10-CM | POA: Diagnosis not present

## 2023-02-22 DIAGNOSIS — Z20822 Contact with and (suspected) exposure to covid-19: Secondary | ICD-10-CM | POA: Diagnosis not present

## 2023-04-09 IMAGING — US US ABDOMEN COMPLETE
1 series · 14 of 25 positions shown · non-contrast
Comparison: None.

CLINICAL DATA: Abdominal pain

EXAM:
ABDOMEN ULTRASOUND COMPLETE

[Series 1: us abdomen complete · 0.20mm/px · 14 of 67 slices shown]
[im 1/67]
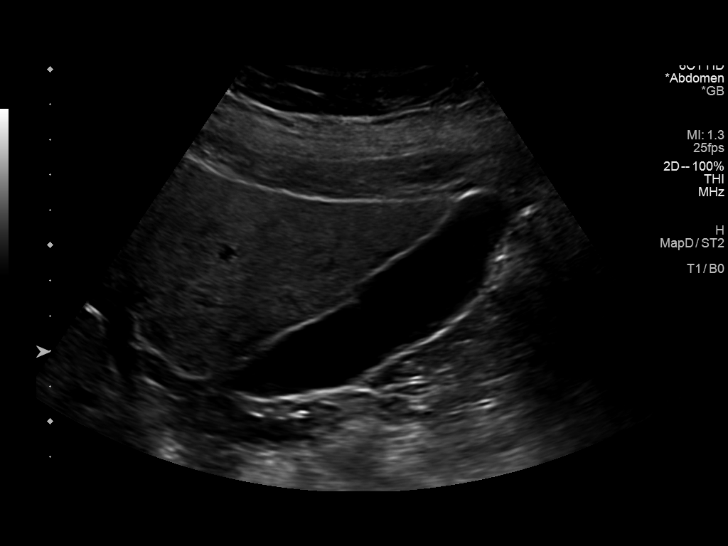
[im 6/67]
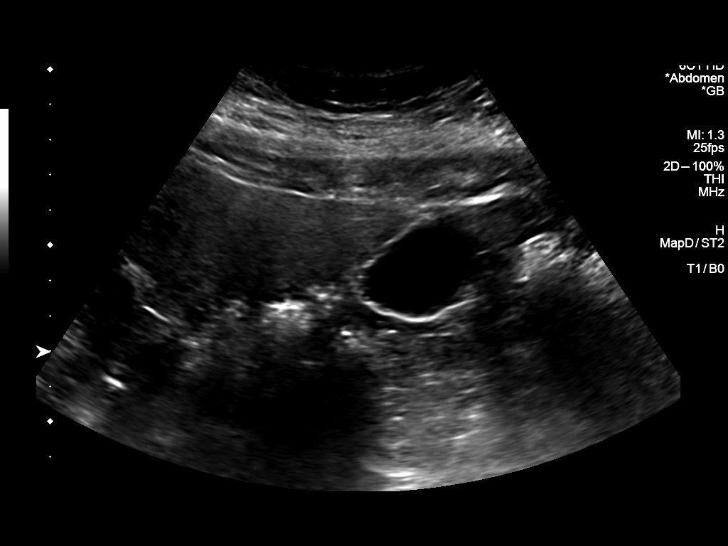
[im 12/67]
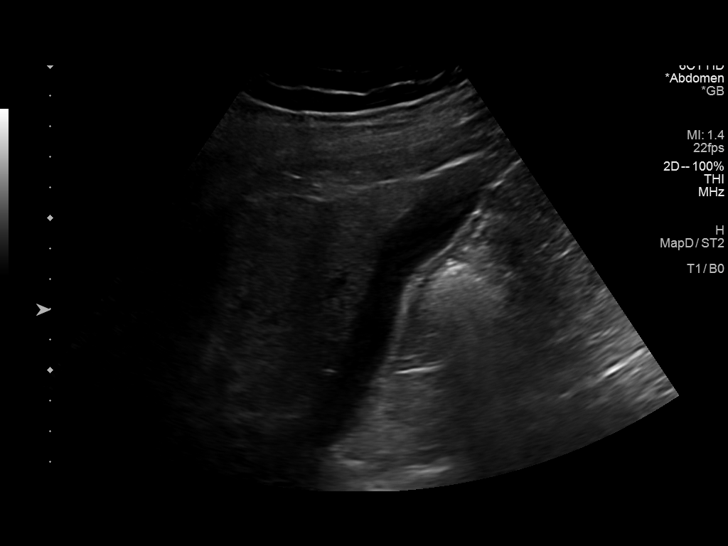
[im 17/67]
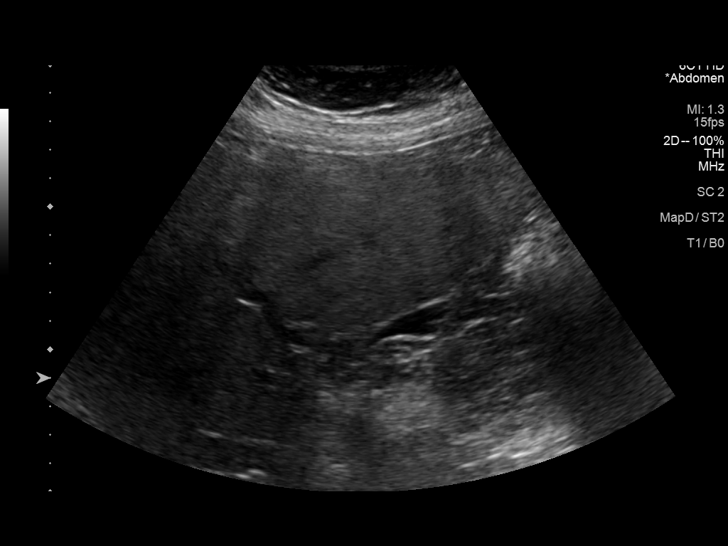
[im 23/67]
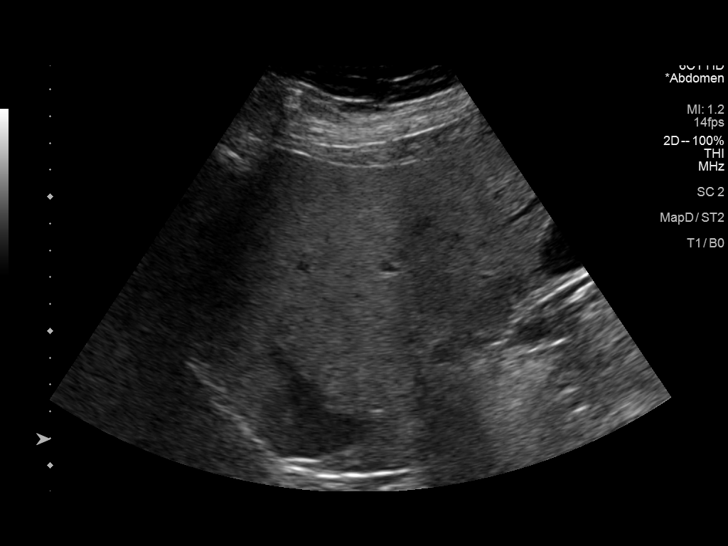
[im 25/67]
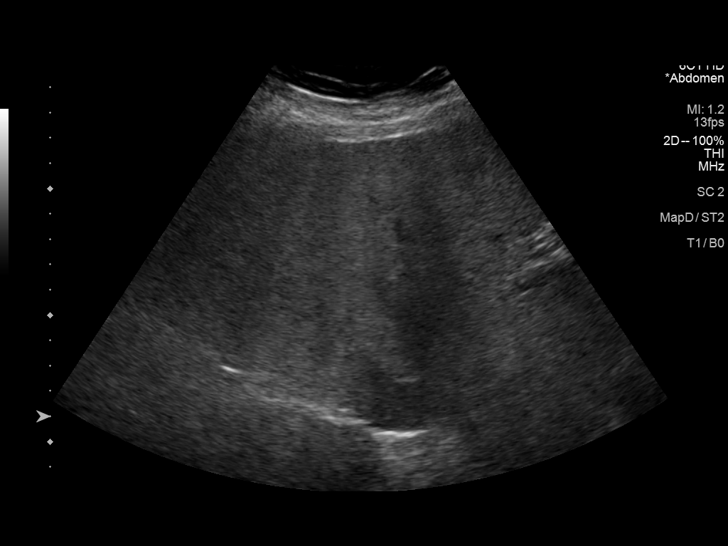
[im 31/67]
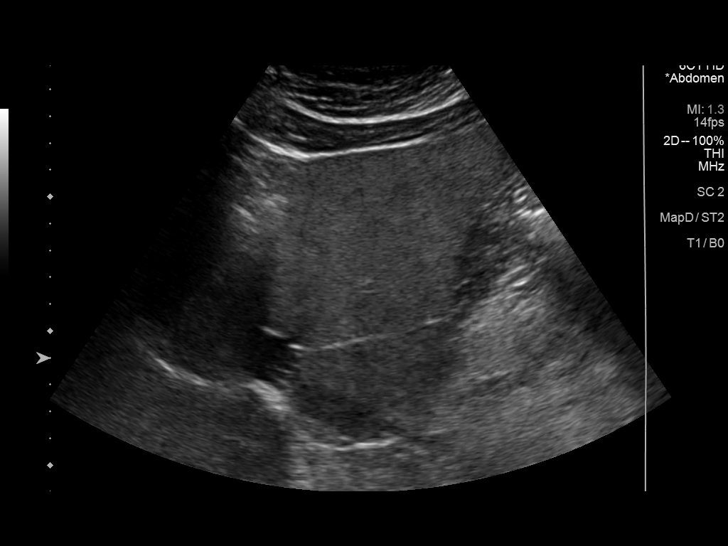
[im 36/67]
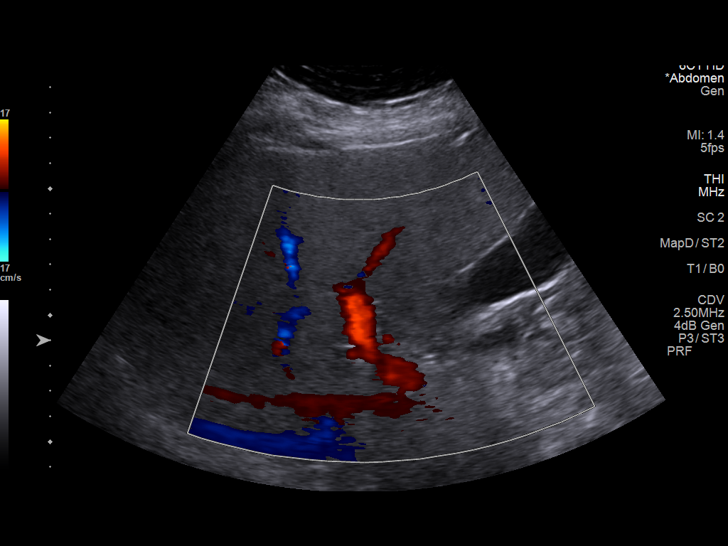
[im 42/67]
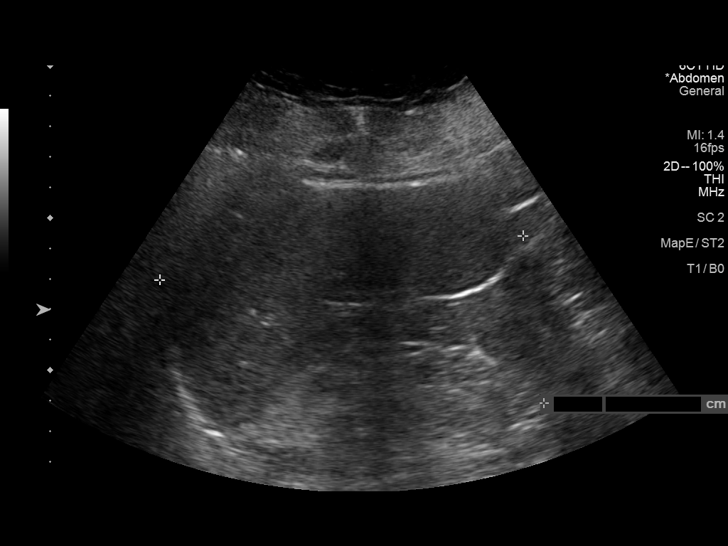
[im 45/67]
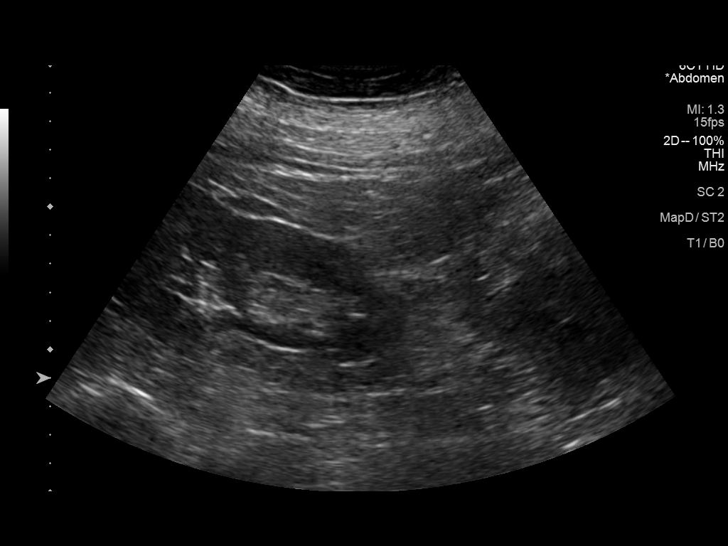
[im 50/67]
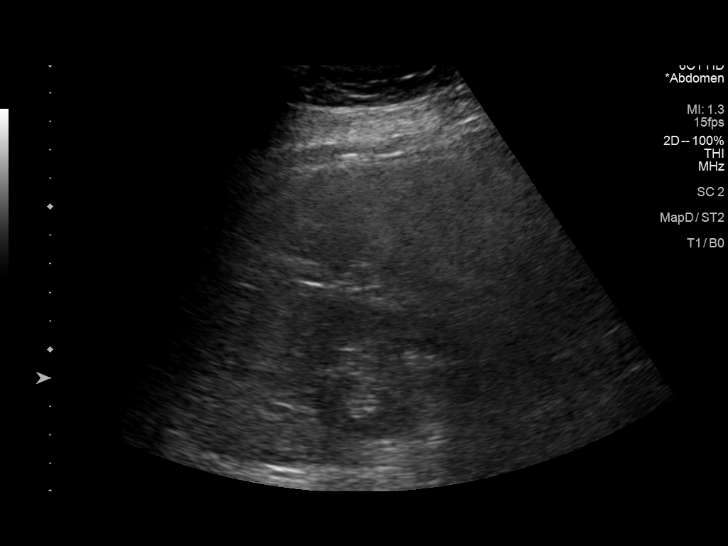
[im 56/67]
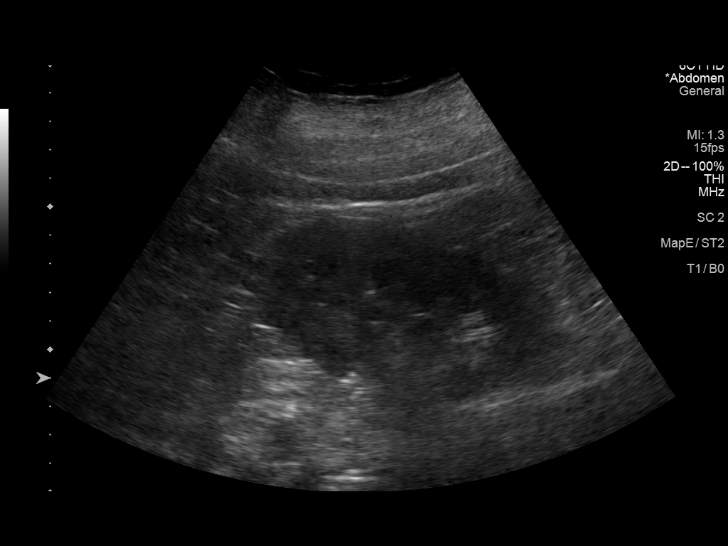
[im 61/67]
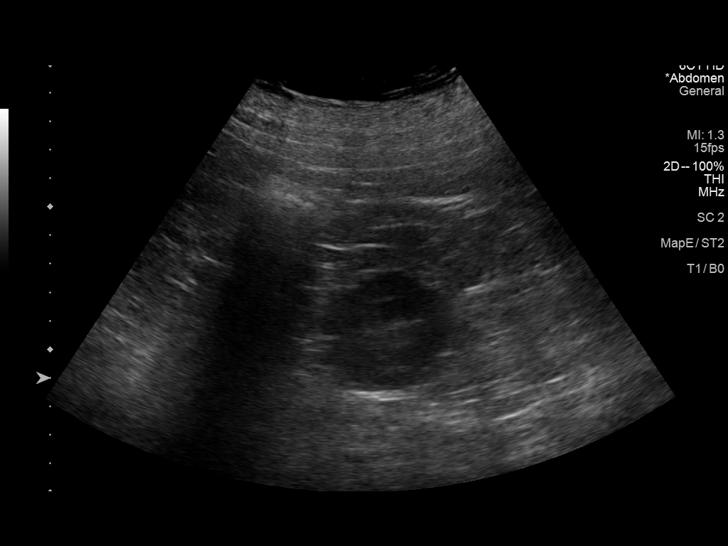
[im 67/67]
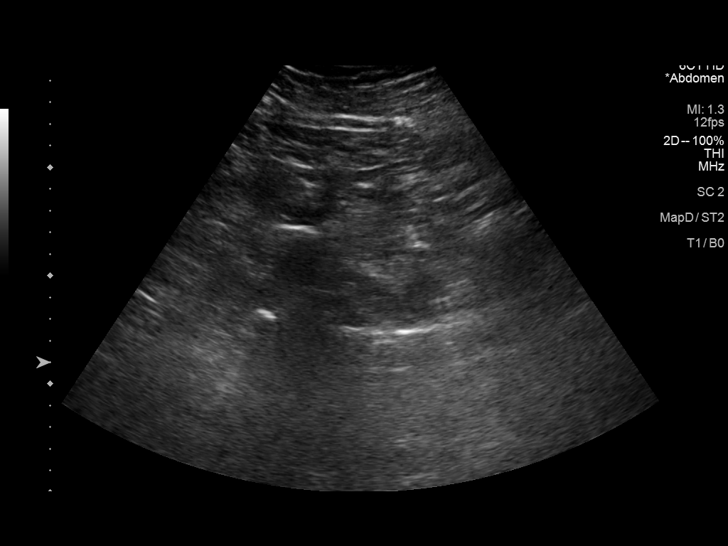

[14 of 25 positions shown; findings below may reference images not displayed]

FINDINGS: Limited study due to body habitus.

Gallbladder: No gallstones or wall thickening visualized. No
sonographic Murphy sign noted by sonographer.

Common bile duct: Diameter: 4 mm

Liver: Coarse, increased echogenicity of the parenchyma with no
focal mass identified. Portal vein is patent on color Doppler
imaging with normal direction of blood flow towards the liver.

IVC: Not visualized.

Pancreas: Visualized portion unremarkable.

Spleen: Upper normal size with no mass identified.

Right Kidney: Length: 11.7 cm. Echogenicity within normal limits. No
mass or hydronephrosis visualized.

Left Kidney: Length: 13.1 cm. Echogenicity within normal limits. No
mass or hydronephrosis visualized.

Abdominal aorta: No aneurysm visualized.

Other findings: None.
IMPRESSION: Coarse, increased echogenicity of the liver parenchyma suggesting
hepatic steatosis and/or other hepatocellular disease.

## 2023-04-18 DIAGNOSIS — R0789 Other chest pain: Secondary | ICD-10-CM | POA: Diagnosis not present

## 2023-04-18 DIAGNOSIS — R918 Other nonspecific abnormal finding of lung field: Secondary | ICD-10-CM | POA: Diagnosis not present

## 2023-05-06 ENCOUNTER — Other Ambulatory Visit: Payer: Self-pay | Admitting: Gastroenterology

## 2023-07-06 DIAGNOSIS — J039 Acute tonsillitis, unspecified: Secondary | ICD-10-CM | POA: Diagnosis not present

## 2023-08-14 DIAGNOSIS — Z758 Other problems related to medical facilities and other health care: Secondary | ICD-10-CM | POA: Diagnosis not present

## 2023-08-14 DIAGNOSIS — R042 Hemoptysis: Secondary | ICD-10-CM | POA: Diagnosis not present

## 2023-08-14 DIAGNOSIS — H6122 Impacted cerumen, left ear: Secondary | ICD-10-CM | POA: Diagnosis not present

## 2024-07-27 ENCOUNTER — Other Ambulatory Visit: Payer: Self-pay | Admitting: Gastroenterology
